# Patient Record
Sex: Female | Born: 1985 | Race: Black or African American | Hispanic: No | Marital: Single | Smoking: Never smoker
Health system: Southern US, Community
[De-identification: ages and names within clinical notes are randomized; demographics above are authoritative.]

## PROBLEM LIST (undated history)

## (undated) DIAGNOSIS — R112 Nausea with vomiting, unspecified: Secondary | ICD-10-CM

## (undated) DIAGNOSIS — D649 Anemia, unspecified: Secondary | ICD-10-CM

## (undated) DIAGNOSIS — R51 Headache: Secondary | ICD-10-CM

## (undated) DIAGNOSIS — D219 Benign neoplasm of connective and other soft tissue, unspecified: Secondary | ICD-10-CM

## (undated) DIAGNOSIS — Z9889 Other specified postprocedural states: Secondary | ICD-10-CM

## (undated) DIAGNOSIS — N809 Endometriosis, unspecified: Secondary | ICD-10-CM

## (undated) DIAGNOSIS — R011 Cardiac murmur, unspecified: Secondary | ICD-10-CM

## (undated) DIAGNOSIS — R519 Headache, unspecified: Secondary | ICD-10-CM

## (undated) HISTORY — DX: Benign neoplasm of connective and other soft tissue, unspecified: D21.9

## (undated) HISTORY — DX: Nausea with vomiting, unspecified: R11.2

## (undated) HISTORY — DX: Endometriosis, unspecified: N80.9

## (undated) HISTORY — DX: Other specified postprocedural states: Z98.890

---

## 2011-06-08 ENCOUNTER — Other Ambulatory Visit: Payer: Self-pay | Admitting: Internal Medicine

## 2011-06-08 DIAGNOSIS — N63 Unspecified lump in unspecified breast: Secondary | ICD-10-CM

## 2011-06-14 ENCOUNTER — Other Ambulatory Visit: Payer: Self-pay

## 2013-01-10 ENCOUNTER — Other Ambulatory Visit (HOSPITAL_COMMUNITY)
Admission: RE | Admit: 2013-01-10 | Discharge: 2013-01-10 | Disposition: A | Discharge status: Home / Self Care | Payer: No Typology Code available for payment source | Source: Ambulatory Visit | Attending: Family Medicine | Admitting: Family Medicine

## 2013-01-10 DIAGNOSIS — Z1151 Encounter for screening for human papillomavirus (HPV): Secondary | ICD-10-CM | POA: Insufficient documentation

## 2013-01-10 DIAGNOSIS — Z01419 Encounter for gynecological examination (general) (routine) without abnormal findings: Secondary | ICD-10-CM | POA: Insufficient documentation

## 2013-01-10 DIAGNOSIS — Z113 Encounter for screening for infections with a predominantly sexual mode of transmission: Secondary | ICD-10-CM | POA: Insufficient documentation

## 2015-10-08 ENCOUNTER — Emergency Department (HOSPITAL_COMMUNITY)
Admission: EM | Admit: 2015-10-08 | Discharge: 2015-10-09 | Disposition: A | Discharge status: Home / Self Care | Payer: 59 | Source: Home / Self Care

## 2015-10-08 ENCOUNTER — Encounter (HOSPITAL_COMMUNITY): Payer: Self-pay | Admitting: Emergency Medicine

## 2015-10-08 DIAGNOSIS — D649 Anemia, unspecified: Secondary | ICD-10-CM | POA: Insufficient documentation

## 2015-10-08 DIAGNOSIS — R112 Nausea with vomiting, unspecified: Secondary | ICD-10-CM | POA: Insufficient documentation

## 2015-10-08 DIAGNOSIS — N83201 Unspecified ovarian cyst, right side: Secondary | ICD-10-CM | POA: Insufficient documentation

## 2015-10-08 DIAGNOSIS — R000 Tachycardia, unspecified: Secondary | ICD-10-CM | POA: Diagnosis not present

## 2015-10-08 DIAGNOSIS — R1030 Lower abdominal pain, unspecified: Secondary | ICD-10-CM | POA: Insufficient documentation

## 2015-10-08 DIAGNOSIS — Z3202 Encounter for pregnancy test, result negative: Secondary | ICD-10-CM | POA: Diagnosis not present

## 2015-10-08 DIAGNOSIS — Z79899 Other long term (current) drug therapy: Secondary | ICD-10-CM | POA: Diagnosis not present

## 2015-10-08 DIAGNOSIS — D72829 Elevated white blood cell count, unspecified: Secondary | ICD-10-CM | POA: Diagnosis not present

## 2015-10-08 HISTORY — DX: Anemia, unspecified: D64.9

## 2015-10-08 LAB — COMPREHENSIVE METABOLIC PANEL
ALK PHOS: 51 U/L (ref 38–126)
ALT: 17 U/L (ref 14–54)
ANION GAP: 11 (ref 5–15)
AST: 23 U/L (ref 15–41)
Albumin: 4.5 g/dL (ref 3.5–5.0)
BILIRUBIN TOTAL: 0.4 mg/dL (ref 0.3–1.2)
BUN: 12 mg/dL (ref 6–20)
CALCIUM: 9.3 mg/dL (ref 8.9–10.3)
CO2: 23 mmol/L (ref 22–32)
Chloride: 106 mmol/L (ref 101–111)
Creatinine, Ser: 0.64 mg/dL (ref 0.44–1.00)
GLUCOSE: 111 mg/dL — AB (ref 65–99)
Potassium: 3.9 mmol/L (ref 3.5–5.1)
Sodium: 140 mmol/L (ref 135–145)
TOTAL PROTEIN: 8.3 g/dL — AB (ref 6.5–8.1)

## 2015-10-08 LAB — CBC
HCT: 42.4 % (ref 36.0–46.0)
HEMOGLOBIN: 14 g/dL (ref 12.0–15.0)
MCH: 27.9 pg (ref 26.0–34.0)
MCHC: 33 g/dL (ref 30.0–36.0)
MCV: 84.6 fL (ref 78.0–100.0)
Platelets: 294 10*3/uL (ref 150–400)
RBC: 5.01 MIL/uL (ref 3.87–5.11)
RDW: 19.4 % — ABNORMAL HIGH (ref 11.5–15.5)
WBC: 20.6 10*3/uL — AB (ref 4.0–10.5)

## 2015-10-08 LAB — LIPASE, BLOOD: Lipase: 22 U/L (ref 11–51)

## 2015-10-08 MED ORDER — ONDANSETRON 4 MG PO TBDP
4.0000 mg | ORAL_TABLET | Freq: Once | ORAL | Status: AC | PRN
  Administered 2015-10-08: 4 mg via ORAL
  Filled 2015-10-08: qty 1

## 2015-10-08 NOTE — ED Notes (Signed)
Below order not completed by EW. 

## 2015-10-08 NOTE — ED Notes (Signed)
Pt states she has lower abd cramping  Pt states she feels weak all over  Pt states she has had some nausea and vomiting

## 2015-10-09 ENCOUNTER — Emergency Department (HOSPITAL_BASED_OUTPATIENT_CLINIC_OR_DEPARTMENT_OTHER): Payer: 59

## 2015-10-09 ENCOUNTER — Encounter (HOSPITAL_BASED_OUTPATIENT_CLINIC_OR_DEPARTMENT_OTHER): Payer: Self-pay | Admitting: Emergency Medicine

## 2015-10-09 ENCOUNTER — Emergency Department (HOSPITAL_BASED_OUTPATIENT_CLINIC_OR_DEPARTMENT_OTHER)
Admission: EM | Admit: 2015-10-09 | Discharge: 2015-10-09 | Disposition: A | Discharge status: Home / Self Care | Payer: 59 | Attending: Emergency Medicine | Admitting: Emergency Medicine

## 2015-10-09 DIAGNOSIS — R1030 Lower abdominal pain, unspecified: Secondary | ICD-10-CM

## 2015-10-09 DIAGNOSIS — N83201 Unspecified ovarian cyst, right side: Secondary | ICD-10-CM

## 2015-10-09 DIAGNOSIS — D72829 Elevated white blood cell count, unspecified: Secondary | ICD-10-CM

## 2015-10-09 DIAGNOSIS — R109 Unspecified abdominal pain: Secondary | ICD-10-CM

## 2015-10-09 LAB — URINALYSIS, ROUTINE W REFLEX MICROSCOPIC
BILIRUBIN URINE: NEGATIVE
Glucose, UA: NEGATIVE mg/dL
KETONES UR: NEGATIVE mg/dL
Leukocytes, UA: NEGATIVE
NITRITE: NEGATIVE
SPECIFIC GRAVITY, URINE: 1.03 (ref 1.005–1.030)
pH: 5 (ref 5.0–8.0)

## 2015-10-09 LAB — WET PREP, GENITAL
Clue Cells Wet Prep HPF POC: NONE SEEN
Sperm: NONE SEEN
Trich, Wet Prep: NONE SEEN
YEAST WET PREP: NONE SEEN

## 2015-10-09 LAB — DIFFERENTIAL
BASOS PCT: 0 %
Basophils Absolute: 0 10*3/uL (ref 0.0–0.1)
EOS ABS: 0 10*3/uL (ref 0.0–0.7)
Eosinophils Relative: 0 %
LYMPHS PCT: 6 %
Lymphs Abs: 1 10*3/uL (ref 0.7–4.0)
Monocytes Absolute: 0.9 10*3/uL (ref 0.1–1.0)
Monocytes Relative: 5 %
NEUTROS PCT: 89 %
Neutro Abs: 16.3 10*3/uL — ABNORMAL HIGH (ref 1.7–7.7)

## 2015-10-09 LAB — URINE MICROSCOPIC-ADD ON

## 2015-10-09 LAB — PREGNANCY, URINE: PREG TEST UR: NEGATIVE

## 2015-10-09 MED ORDER — IOPAMIDOL (ISOVUE-300) INJECTION 61%
100.0000 mL | Freq: Once | INTRAVENOUS | Status: AC | PRN
  Administered 2015-10-09: 100 mL via INTRAVENOUS

## 2015-10-09 MED ORDER — SODIUM CHLORIDE 0.9 % IV BOLUS (SEPSIS)
1000.0000 mL | Freq: Once | INTRAVENOUS | Status: AC
  Administered 2015-10-09: 1000 mL via INTRAVENOUS

## 2015-10-09 MED ORDER — NAPROXEN 500 MG PO TABS: 500.0000 mg | ORAL_TABLET | Freq: Two times a day (BID) | ORAL | Status: DC

## 2015-10-09 MED ORDER — ONDANSETRON HCL 4 MG/2ML IJ SOLN
4.0000 mg | Freq: Once | INTRAMUSCULAR | Status: AC
  Administered 2015-10-09: 4 mg via INTRAVENOUS
  Filled 2015-10-09: qty 2

## 2015-10-09 MED ORDER — MORPHINE SULFATE (PF) 4 MG/ML IV SOLN
4.0000 mg | Freq: Once | INTRAVENOUS | Status: AC
  Administered 2015-10-09: 4 mg via INTRAVENOUS
  Filled 2015-10-09: qty 1

## 2015-10-09 MED ORDER — HYDROCODONE-ACETAMINOPHEN 5-325 MG PO TABS: 1.0000 | ORAL_TABLET | Freq: Four times a day (QID) | ORAL | Status: DC | PRN

## 2015-10-09 MED FILL — HYDROCODON-APAP 5-325: 5-325 | 2 days supply | Qty: 10 | Fill #0

## 2015-10-09 MED FILL — NAPROXEN 500 MG TABLET: 500 | 7 days supply | Qty: 14 | Fill #0

## 2015-10-09 NOTE — Discharge Instructions (Signed)
It is important that you follow-up with GYN faculty practice at [facility] hospital. Call [phone]. Also you can follow-up with the acute walk-in clinic at [facility] hospital. Further evaluation of this right ovarian cystic mass is important. Should be done in the next few weeks. Take the anti-inflammatory medicine as directed take pain medicine as needed. Return for any new or worse symptoms.

## 2015-10-09 NOTE — ED Notes (Signed)
Pt ambulated to bathroom 

## 2015-10-09 NOTE — ED Notes (Signed)
Pad given to pt.

## 2015-10-09 NOTE — ED Provider Notes (Signed)
Results for orders placed or performed during the hospital encounter of 10/09/15  Wet prep, genital  Result Value Ref Range   Yeast Wet Prep HPF POC NONE SEEN NONE SEEN   Trich, Wet Prep NONE SEEN NONE SEEN   Clue Cells Wet Prep HPF POC NONE SEEN NONE SEEN   WBC, Wet Prep HPF POC MODERATE (A) NONE SEEN   Sperm NONE SEEN   Differential  Result Value Ref Range   Neutrophils Relative % 89 %   Neutro Abs 16.3 (H) 1.7 - 7.7 K/uL   Lymphocytes Relative 6 %   Lymphs Abs 1.0 0.7 - 4.0 K/uL   Monocytes Relative 5 %   Monocytes Absolute 0.9 0.1 - 1.0 K/uL   Eosinophils Relative 0 %   Eosinophils Absolute 0.0 0.0 - 0.7 K/uL   Basophils Relative 0 %   Basophils Absolute 0.0 0.0 - 0.1 K/uL  Urinalysis, Routine w reflex microscopic (not at [facility])  Result Value Ref Range   Color, Urine RED (A) YELLOW   APPearance CLOUDY (A) CLEAR   Specific Gravity, Urine 1.030 1.005 - 1.030   pH 5.0 5.0 - 8.0   Glucose, UA NEGATIVE NEGATIVE mg/dL   Hgb urine dipstick LARGE (A) NEGATIVE   Bilirubin Urine NEGATIVE NEGATIVE   Ketones, ur NEGATIVE NEGATIVE mg/dL   Protein, ur >300 (A) NEGATIVE mg/dL   Nitrite NEGATIVE NEGATIVE   Leukocytes, UA NEGATIVE NEGATIVE  Pregnancy, urine  Result Value Ref Range   Preg Test, Ur NEGATIVE NEGATIVE  Urine microscopic-add on  Result Value Ref Range   Squamous Epithelial / LPF 0-5 (A) NONE SEEN   WBC, UA 0-5 0 - 5 WBC/hpf   RBC / HPF TOO NUMEROUS TO COUNT 0 - 5 RBC/hpf   Bacteria, UA RARE (A) NONE SEEN   Urine-Other AMORPHOUS URATES/PHOSPHATES    US Transvaginal Non-ob  10/09/2015  CLINICAL DATA:  Acute onset mid pelvic pain, nausea and vomiting yesterday. Fibroids. Right adnexal mass seen on recent CT. LMP 10/08/2015. EXAM: TRANSABDOMINAL AND TRANSVAGINAL ULTRASOUND OF PELVIS DOPPLER ULTRASOUND OF OVARIES TECHNIQUE: Both transabdominal and transvaginal ultrasound examinations of the pelvis were performed. Transabdominal technique was performed for global imaging of the  pelvis including uterus, ovaries, adnexal regions, and pelvic cul-de-sac. It was necessary to proceed with endovaginal exam following the transabdominal exam to visualize the endometrium and complex right adnexal mass. Color and duplex Doppler ultrasound was utilized to evaluate blood flow to the ovaries. COMPARISON:  CT on 10/09/2015 FINDINGS: Uterus Measurements: 10.2 x 5.4 x 5.5 cm. Several uterine fibroids are seen which are intramural and subserosal location in the upper uterine body and fundus. These range in size from 2.0 cm to 5.8 cm. Endometrium Thickness: 9 mm.  Partially obscured by uterine fibroids. Right ovary Measurements: No normal ovary visualized. A complex cystic and solid mass is seen in the right adnexa which measures 6.7 by 4.6 by 4.8 cm. This mass shows several septations and a solid appearing hyperechoic component, but no corresponding fat is seen in this lesion on recent CT. Some internal blood flow is seen within the periphery as well as the central solid portions of this mass on color Doppler ultrasound. Left ovary Measurements: 3.6 x 2.1 x 2.4 cm transabdominally. Normal transabdominal appearance. Pulsed Doppler evaluation shows normal low-resistance arterial and venous waveforms in the left ovary as well as the right adnexal mass. Other findings Small amount of complex free fluid noted. IMPRESSION: Multiple intramural and subserosal fibroids, largest measuring 5.8 cm. 6.7  cm indeterminate complex cystic mass in right adnexa, which does show internal blood flow. Differential diagnosis includes ovarian neoplasm, degenerated pedunculated fibroid, and endometrioma. No definite sonographic findings of ovarian torsion. Recommend GYN consultation, and consider surgical evaluation or pelvic MRI without and with contrast for further characterization. Electronically Signed   By: Earle Gell M.D.   On: 10/09/2015 10:25   US Pelvis Complete  10/09/2015  CLINICAL DATA:  Acute onset mid pelvic pain,  nausea and vomiting yesterday. Fibroids. Right adnexal mass seen on recent CT. LMP 10/08/2015. EXAM: TRANSABDOMINAL AND TRANSVAGINAL ULTRASOUND OF PELVIS DOPPLER ULTRASOUND OF OVARIES TECHNIQUE: Both transabdominal and transvaginal ultrasound examinations of the pelvis were performed. Transabdominal technique was performed for global imaging of the pelvis including uterus, ovaries, adnexal regions, and pelvic cul-de-sac. It was necessary to proceed with endovaginal exam following the transabdominal exam to visualize the endometrium and complex right adnexal mass. Color and duplex Doppler ultrasound was utilized to evaluate blood flow to the ovaries. COMPARISON:  CT on 10/09/2015 FINDINGS: Uterus Measurements: 10.2 x 5.4 x 5.5 cm. Several uterine fibroids are seen which are intramural and subserosal location in the upper uterine body and fundus. These range in size from 2.0 cm to 5.8 cm. Endometrium Thickness: 9 mm.  Partially obscured by uterine fibroids. Right ovary Measurements: No normal ovary visualized. A complex cystic and solid mass is seen in the right adnexa which measures 6.7 by 4.6 by 4.8 cm. This mass shows several septations and a solid appearing hyperechoic component, but no corresponding fat is seen in this lesion on recent CT. Some internal blood flow is seen within the periphery as well as the central solid portions of this mass on color Doppler ultrasound. Left ovary Measurements: 3.6 x 2.1 x 2.4 cm transabdominally. Normal transabdominal appearance. Pulsed Doppler evaluation shows normal low-resistance arterial and venous waveforms in the left ovary as well as the right adnexal mass. Other findings Small amount of complex free fluid noted. IMPRESSION: Multiple intramural and subserosal fibroids, largest measuring 5.8 cm. 6.7 cm indeterminate complex cystic mass in right adnexa, which does show internal blood flow. Differential diagnosis includes ovarian neoplasm, degenerated pedunculated fibroid,  and endometrioma. No definite sonographic findings of ovarian torsion. Recommend GYN consultation, and consider surgical evaluation or pelvic MRI without and with contrast for further characterization. Electronically Signed   By: Earle Gell M.D.   On: 10/09/2015 10:25   Ct Abdomen Pelvis W Contrast  10/09/2015  CLINICAL DATA:  Lower abdominal and pelvic pain. The pain began after a bowel movement 12 hours ago. EXAM: CT ABDOMEN AND PELVIS WITH CONTRAST TECHNIQUE: Multidetector CT imaging of the abdomen and pelvis was performed using the standard protocol following bolus administration of intravenous contrast. CONTRAST:  148mL ISOVUE-300 IOPAMIDOL (ISOVUE-300) INJECTION 61% COMPARISON:  None. FINDINGS: Lower chest: The lung bases are clear without focal nodule mass or airspace disease. The heart size is normal. No significant pleural or pericardial effusion is present. Hepatobiliary: The liver, gallbladder, and the common bile duct are within normal limits. Pancreas: Unremarkable Spleen: Within normal limits Adrenals/Urinary Tract: The adrenal glands are normal bilaterally. Kidneys and ureters are within normal limits. The urinary bladder is mostly collapsed. Stomach/Bowel: The stomach and duodenum are within normal limits. The small bowel is unremarkable. The appendix and cecum are visualized and normal. The ascending and transverse colon are within normal limits. The descending and rectosigmoid colon are unremarkable. There is some stranding within the mesentery throughout the abdomen. Vascular/Lymphatic: No significant atherosclerotic calcifications, stenosis,  or aneurysm. Reproductive: 2 large exophytic fibroids are present at the uterine fundus. The largest measures 5.7 cm. Multiple additional fibroids are present throughout the uterus. A low-density right adnexal mass measures 4.4 x 5.4 x 6.5 cm. A moderate amount of free fluid is present within the anatomic pelvis. The left ovary is visualized and within  normal limits. Other: A moderate amount of free fluid is present in the anatomic pelvis. The fluid is of intermediate density measuring approximately 30 Hounsfield units. Musculoskeletal: Bone windows are unremarkable. No focal lytic or blastic lesions are present. IMPRESSION: 1. 6.5 cm low-density right adnexal mass. The differential diagnosis includes torsion of the ovary, tubo-ovarian abscess, or less likely neoplasm. Recommend pelvic ultrasound with Doppler examination for further evaluation. 2. Fibroid uterus.  This could be a separate source of pain. 3. High-density pelvic fluid. This raises concern for infection or hemorrhage. No active hemorrhage is evident. 4. The bowel appears to be within normal limits. These results were called by telephone at the time of interpretation on 10/09/2015 at 7:16 am to Dr. Rogene Houston, who verbally acknowledged these results. Electronically Signed   By: San Morelle M.D.   On: 10/09/2015 07:26   [place] Art/ven Flow Abd Pelv Doppler  10/09/2015  CLINICAL DATA:  Acute onset mid pelvic pain, nausea and vomiting yesterday. Fibroids. Right adnexal mass seen on recent CT. LMP 10/08/2015. EXAM: TRANSABDOMINAL AND TRANSVAGINAL ULTRASOUND OF PELVIS DOPPLER ULTRASOUND OF OVARIES TECHNIQUE: Both transabdominal and transvaginal ultrasound examinations of the pelvis were performed. Transabdominal technique was performed for global imaging of the pelvis including uterus, ovaries, adnexal regions, and pelvic cul-de-sac. It was necessary to proceed with endovaginal exam following the transabdominal exam to visualize the endometrium and complex right adnexal mass. Color and duplex Doppler ultrasound was utilized to evaluate blood flow to the ovaries. COMPARISON:  CT on 10/09/2015 FINDINGS: Uterus Measurements: 10.2 x 5.4 x 5.5 cm. Several uterine fibroids are seen which are intramural and subserosal location in the upper uterine body and fundus. These range in size from 2.0 cm to 5.8 cm.  Endometrium Thickness: 9 mm.  Partially obscured by uterine fibroids. Right ovary Measurements: No normal ovary visualized. A complex cystic and solid mass is seen in the right adnexa which measures 6.7 by 4.6 by 4.8 cm. This mass shows several septations and a solid appearing hyperechoic component, but no corresponding fat is seen in this lesion on recent CT. Some internal blood flow is seen within the periphery as well as the central solid portions of this mass on color Doppler ultrasound. Left ovary Measurements: 3.6 x 2.1 x 2.4 cm transabdominally. Normal transabdominal appearance. Pulsed Doppler evaluation shows normal low-resistance arterial and venous waveforms in the left ovary as well as the right adnexal mass. Other findings Small amount of complex free fluid noted. IMPRESSION: Multiple intramural and subserosal fibroids, largest measuring 5.8 cm. 6.7 cm indeterminate complex cystic mass in right adnexa, which does show internal blood flow. Differential diagnosis includes ovarian neoplasm, degenerated pedunculated fibroid, and endometrioma. No definite sonographic findings of ovarian torsion. Recommend GYN consultation, and consider surgical evaluation or pelvic MRI without and with contrast for further characterization. Electronically Signed   By: Earle Gell M.D.   On: 10/09/2015 10:25    Patient with complex right ovarian cystic mass. No evidence of torsion. Early and close follow-up with GYN is important. Patient given referral information to [facility] walk[facility] clinic and to the GYN faculty service.  Fortunately was able to get the ultrasound done  here for further evaluation.  Fredia Sorrow, MD 10/09/15 1039

## 2015-10-09 NOTE — ED Notes (Signed)
Pt transported to US

## 2015-10-09 NOTE — ED Notes (Addendum)
Pt reports severe abd pain onset yesterday after attempting BM

## 2015-10-09 NOTE — ED Provider Notes (Addendum)
TIME SEEN: 4:35 AM  CHIEF COMPLAINT: Abdominal pain, vomiting  HPI: Pt is a 30 y.o. female with history of anemia who is on iron who presents to the emergency department with severe lower abdominal pain that started yesterday after having a bowel movement. Reports he was able to have a bowel movement without blood. States her stools are always slightly black because of iron tablets but have not changed. Reports pain is worse in the left lower quadrant. She has felt hot and cold but has not measured a fever. Has had nausea and vomiting. No diarrhea. Denies dysuria, hematuria, vaginal discharge. Started her menstrual cycle today. No sick contacts or recent travel. No history of abdominal surgery. She is a G2 P0 A2.  Is sexually active with one partner.  Has had trich in the past that was treated.  ROS: See HPI Constitutional: no fever  Eyes: no drainage  ENT: no runny nose   Cardiovascular:  no chest pain  Resp: no SOB  GI: vomiting GU: no dysuria Integumentary: no rash  Allergy: no hives  Musculoskeletal: no leg swelling  Neurological: no slurred speech ROS otherwise negative  PAST MEDICAL HISTORY/PAST SURGICAL HISTORY:  Past Medical History  Diagnosis Date  . Anemia     MEDICATIONS:  Prior to Admission medications   Medication Sig Start Date End Date Taking? Authorizing Provider  ferrous sulfate 325 (65 FE) MG tablet Take 325 mg by mouth 3 (three) times daily. 09/08/15   Historical Provider, MD  ibuprofen (ADVIL,MOTRIN) 200 MG tablet Take 200 mg by mouth every 6 (six) hours as needed for moderate pain.    Historical Provider, MD  Multiple Vitamin (MULTIVITAMIN WITH MINERALS) TABS tablet Take 1 tablet by mouth daily.    Historical Provider, MD    ALLERGIES:  Allergies  Allergen Reactions  . Other Hives and Other (See Comments)    mango    SOCIAL HISTORY:  Social History  Substance Use Topics  . Smoking status: Never Smoker   . Smokeless tobacco: Not on file  . Alcohol Use:  No    FAMILY HISTORY: Family History  Problem Relation Age of Onset  . Hypertension Other   . Diabetes Other     EXAM: BP 135/81 mmHg  Pulse 109  Temp(Src) 98.3 F (36.8 C) (Oral)  Resp 18  SpO2 100%  LMP 09/13/2015 (Approximate) CONSTITUTIONAL: Alert and oriented and responds appropriately to questions. Well-appearing; well-nourished HEAD: Normocephalic EYES: Conjunctivae clear, PERRL ENT: normal nose; no rhinorrhea; moist mucous membranes NECK: Supple, no meningismus, no LAD  CARD: regular and minimally tachycardic; S1 and S2 appreciated; no murmurs, no clicks, no rubs, no gallops RESP: Normal chest excursion without splinting or tachypnea; breath sounds clear and equal bilaterally; no wheezes, no rhonchi, no rales, no hypoxia or respiratory distress, speaking full sentences ABD/GI: Normal bowel sounds; non-distended; soft,Tender to palpation throughout the lower abdomen with some guarding in the right and left lower quadrants, no rebound, no peritoneal signs GU:  Normal external genitalia. No lesions, rashes noted. Patient has moderate dark red vaginal bleeding on exam coming from the cervical os. No vaginal discharge.  No adnexal tenderness, mass or fullness, no cervical motion tenderness. Cervix is not appear friable.  Cervix is closed.  Chaperone present for exam. She did not seem to have a specific cervical motion tenderness or adnexal tenderness on exam but was tender over her lower abdomen during palpation during the bimanual exam. BACK:  The back appears normal and is non-tender to palpation,  there is no CVA tenderness EXT: Normal ROM in all joints; non-tender to palpation; no edema; normal capillary refill; no cyanosis, no calf tenderness or swelling    SKIN: Normal color for age and race; warm; no rash NEURO: Moves all extremities equally, sensation to light touch intact diffusely, cranial nerves II through XII intact PSYCH: The patient's mood and manner are appropriate.  Grooming and personal hygiene are appropriate.  MEDICAL DECISION MAKING: Patient here with abdominal pain, vomiting, leukocytosis. Was in the emergency room and at [facility] and left prior to being seen by a physician secondary to a time. Have a leukocytosis of 20,000 there. Otherwise labs are unremarkable.  Differential shows that she does have a left shift. Urine shows blood but this is likely secondary to her menstrual cycle. She is not pregnant. No other sign of infection in her urine. Wet prep shows moderate WBCs. She had tenderness over the suprapubic area when I palpated her abdomen on pelvic exam but did not have  adnexal tenderness or cervical motion tenderness appreciated. Given her lower abdominal pain and leukocytosis and concern for possible appendicitis, diverticulitis, colitis. We'll obtain a CT scan. Pain is improved with IV morphine.  ED PROGRESS: 7:30 AM  Signed out to Dr. Rogene Houston to follow up on CT scan.  She is not pregnant so doubt ectopic.  No significant vag dc or CMT to suggest PID.  Cervix is normal, doubt cervicitis.  GC/Chlamydia pending.  Wet prep negative for trich.  No adnexal tenderness to suggest TOA or torsion.       Malcolm, DO 10/09/15 [phone]

## 2015-10-09 NOTE — ED Notes (Signed)
Pt reported to registration clerk that they were leaving  

## 2015-10-12 LAB — GC/CHLAMYDIA PROBE AMP (~~LOC~~) NOT AT ARMC
Chlamydia: NEGATIVE
Neisseria Gonorrhea: NEGATIVE

## 2015-10-29 ENCOUNTER — Ambulatory Visit (INDEPENDENT_AMBULATORY_CARE_PROVIDER_SITE_OTHER): Payer: 59 | Admitting: Family Medicine

## 2015-10-29 ENCOUNTER — Encounter: Payer: Self-pay | Admitting: Family Medicine

## 2015-10-29 VITALS — BP 134/82 | HR 98 | Temp 98.3°F | Ht 64.0 in | Wt 210.8 lb

## 2015-10-29 DIAGNOSIS — N83201 Unspecified ovarian cyst, right side: Secondary | ICD-10-CM

## 2015-10-29 NOTE — Patient Instructions (Signed)
Ovarian Cyst An ovarian cyst is a fluid-filled sac that forms on an ovary. The ovaries are small organs that produce eggs in women. Various types of cysts can form on the ovaries. Most are not cancerous. Many do not cause problems, and they often go away on their own. Some may cause symptoms and require treatment. Common types of ovarian cysts include:  Functional cysts--These cysts may occur every month during the menstrual cycle. This is normal. The cysts usually go away with the next menstrual cycle if the woman does not get pregnant. Usually, there are no symptoms with a functional cyst.  Endometrioma cysts--These cysts form from the tissue that lines the uterus. They are also called "chocolate cysts" because they become filled with blood that turns brown. This type of cyst can cause pain in the lower abdomen during intercourse and with your menstrual period.  Cystadenoma cysts--This type develops from the cells on the outside of the ovary. These cysts can get very big and cause lower abdomen pain and pain with intercourse. This type of cyst can twist on itself, cut off its blood supply, and cause severe pain. It can also easily rupture and cause a lot of pain.  Dermoid cysts--This type of cyst is sometimes found in both ovaries. These cysts may contain different kinds of body tissue, such as skin, teeth, hair, or cartilage. They usually do not cause symptoms unless they get very big.  Theca lutein cysts--These cysts occur when too much of a certain hormone (human chorionic gonadotropin) is produced and overstimulates the ovaries to produce an egg. This is most common after procedures used to assist with the conception of a baby (in vitro fertilization). CAUSES   Fertility drugs can cause a condition in which multiple large cysts are formed on the ovaries. This is called ovarian hyperstimulation syndrome.  A condition called polycystic ovary syndrome can cause hormonal imbalances that can lead to  nonfunctional ovarian cysts. SIGNS AND SYMPTOMS  Many ovarian cysts do not cause symptoms. If symptoms are present, they may include:  Pelvic pain or pressure.  Pain in the lower abdomen.  Pain during sexual intercourse.  Increasing girth (swelling) of the abdomen.  Abnormal menstrual periods.  Increasing pain with menstrual periods.  Stopping having menstrual periods without being pregnant. DIAGNOSIS  These cysts are commonly found during a routine or annual pelvic exam. Tests may be ordered to find out more about the cyst. These tests may include:  Ultrasound.  X-ray of the pelvis.  CT scan.  MRI.  Blood tests. TREATMENT  Many ovarian cysts go away on their own without treatment. Your health care provider may want to check your cyst regularly for 2-3 months to see if it changes. For women in menopause, it is particularly important to monitor a cyst closely because of the higher rate of ovarian cancer in menopausal women. When treatment is needed, it may include any of the following:  A procedure to drain the cyst (aspiration). This may be done using a long needle and ultrasound. It can also be done through a laparoscopic procedure. This involves using a thin, lighted tube with a tiny camera on the end (laparoscope) inserted through a small incision.  Surgery to remove the whole cyst. This may be done using laparoscopic surgery or an open surgery involving a larger incision in the lower abdomen.  Hormone treatment or birth control pills. These methods are sometimes used to help dissolve a cyst. HOME CARE INSTRUCTIONS   Only take over-the-counter   or prescription medicines as directed by your health care provider.  Follow up with your health care provider as directed.  Get regular pelvic exams and Pap tests. SEEK MEDICAL CARE IF:   Your periods are late, irregular, or painful, or they stop.  Your pelvic pain or abdominal pain does not go away.  Your abdomen becomes  larger or swollen.  You have pressure on your bladder or trouble emptying your bladder completely.  You have pain during sexual intercourse.  You have feelings of fullness, pressure, or discomfort in your stomach.  You lose weight for no apparent reason.  You feel generally ill.  You become constipated.  You lose your appetite.  You develop acne.  You have an increase in body and facial hair.  You are gaining weight, without changing your exercise and eating habits.  You think you are pregnant. SEEK IMMEDIATE MEDICAL CARE IF:   You have increasing abdominal pain.  You feel sick to your stomach (nauseous), and you throw up (vomit).  You develop a fever that comes on suddenly.  You have abdominal pain during a bowel movement.  Your menstrual periods become heavier than usual. MAKE SURE YOU:  Understand these instructions.  Will watch your condition.  Will get help right away if you are not doing well or get worse.   This information is not intended to replace advice given to you by your health care provider. Make sure you discuss any questions you have with your health care provider.   Document Released: 06/27/2005 Document Revised: 07/02/2013 Document Reviewed: 03/04/2013 [employer] Interactive Patient Education 2016 [employer]  

## 2015-10-29 NOTE — Progress Notes (Signed)
   Subjective:    Patient ID: Katie Ward, female    DOB: 18-Aug-1985, 30 y.o.   MRN: [id number]  HPI Patient seen for ED follow up of ovarian cyst - she was seen 3/30-31 at [facility].  Presented to hospital with moderate to severe cramping right lower quadrant that was nonradiating.  The pain doubled her over.  Pain has been improving since her ED visit - pain has completely resolved now.  No bleeding, vaginal discharge, dysuria, constipation.     Review of Systems  Constitutional: Negative for fever, chills and fatigue.   I have reviewed the patients past medical, family, and social history.  I have reviewed the patient's medication list and allergies.     Objective:   Physical Exam  Constitutional: She is oriented to person, place, and time. She appears well-developed and well-nourished.  HENT:  Head: Normocephalic and atraumatic.  Cardiovascular: Normal rate, regular rhythm and normal heart sounds.  Exam reveals no gallop and no friction rub.   No murmur heard. Pulmonary/Chest: Effort normal and breath sounds normal. No respiratory distress. She has no wheezes. She has no rales. She exhibits no tenderness.  Abdominal: Soft. Bowel sounds are normal. She exhibits no distension and no mass. There is no tenderness. There is no rebound and no guarding.  Neurological: She is alert and oriented to person, place, and time.  Skin: Skin is warm and dry.  Psychiatric: She has a normal mood and affect. Her behavior is normal. Judgment and thought content normal.      Assessment & Plan:  1. Right ovarian cyst No symptoms.  If cyst same or larger, will get MRI.  If US shows resolving, will screen appropriately.  Schedule for annual exam. - US Pelvis Limited; Future - CA 125

## 2015-10-30 LAB — CA 125: CA 125: 45 U/mL — ABNORMAL HIGH (ref <35)

## 2015-11-19 ENCOUNTER — Other Ambulatory Visit: Payer: Self-pay

## 2015-11-19 ENCOUNTER — Other Ambulatory Visit: Payer: Self-pay | Admitting: Family Medicine

## 2015-11-19 ENCOUNTER — Ambulatory Visit (HOSPITAL_COMMUNITY)
Admission: RE | Admit: 2015-11-19 | Discharge: 2015-11-19 | Disposition: A | Discharge status: Home / Self Care | Payer: 59 | Source: Ambulatory Visit | Attending: Family Medicine | Admitting: Family Medicine

## 2015-11-19 DIAGNOSIS — N83201 Unspecified ovarian cyst, right side: Secondary | ICD-10-CM

## 2015-11-19 DIAGNOSIS — N858 Other specified noninflammatory disorders of uterus: Secondary | ICD-10-CM | POA: Diagnosis not present

## 2015-11-19 DIAGNOSIS — D259 Leiomyoma of uterus, unspecified: Secondary | ICD-10-CM | POA: Insufficient documentation

## 2015-11-20 NOTE — Progress Notes (Signed)
Quick Note:  Discussed results with patient - cyst is enlarging. Needs appt with GYN surgeon for ovarian cystectomy and removal pedunculated firbroids ______

## 2015-12-17 ENCOUNTER — Ambulatory Visit (INDEPENDENT_AMBULATORY_CARE_PROVIDER_SITE_OTHER): Payer: 59 | Admitting: Obstetrics and Gynecology

## 2015-12-17 ENCOUNTER — Encounter: Payer: Self-pay | Admitting: Obstetrics and Gynecology

## 2015-12-17 VITALS — BP 140/70 | HR 79 | Ht 64.0 in | Wt 217.9 lb

## 2015-12-17 DIAGNOSIS — N9489 Other specified conditions associated with female genital organs and menstrual cycle: Secondary | ICD-10-CM

## 2015-12-17 DIAGNOSIS — N949 Unspecified condition associated with female genital organs and menstrual cycle: Secondary | ICD-10-CM | POA: Diagnosis not present

## 2015-12-17 DIAGNOSIS — N80129 Deep endometriosis of ovary, unspecified ovary: Secondary | ICD-10-CM | POA: Insufficient documentation

## 2015-12-17 DIAGNOSIS — N801 Endometriosis of ovary: Secondary | ICD-10-CM | POA: Insufficient documentation

## 2015-12-17 LAB — POCT URINALYSIS DIP (DEVICE)
BILIRUBIN URINE: NEGATIVE
Glucose, UA: NEGATIVE mg/dL
HGB URINE DIPSTICK: NEGATIVE
Ketones, ur: NEGATIVE mg/dL
Leukocytes, UA: NEGATIVE
Nitrite: NEGATIVE
Protein, ur: NEGATIVE mg/dL
SPECIFIC GRAVITY, URINE: 1.025 (ref 1.005–1.030)
Urobilinogen, UA: 0.2 mg/dL (ref 0.0–1.0)
pH: 6 (ref 5.0–8.0)
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
## 2015-12-18 ENCOUNTER — Encounter (HOSPITAL_COMMUNITY): Payer: Self-pay | Admitting: *Deleted

## 2016-01-21 ENCOUNTER — Other Ambulatory Visit: Payer: Self-pay | Admitting: Obstetrics and Gynecology

## 2016-01-25 NOTE — Patient Instructions (Signed)
Your procedure is scheduled on:  Tuesday, Feb 09, 2016  Enter through the [employer] [facility] at:  6:00 AM  Pick up the phone at the desk and dial [phone].  Call this number if you have problems the morning of surgery: [phone].  Remember: Do NOT eat food or drink after:  Midnight Monday, June 31, 2017  Take these medicines the morning of surgery with a SIP OF WATER:  None  Do NOT wear jewelry (body piercing), metal hair clips/bobby pins, make-up, or nail polish. Do NOT wear lotions, powders, or perfumes.  You may wear deodorant. Do NOT shave for 48 hours prior to surgery. Do NOT bring valuables to the hospital. Contacts, dentures, or bridgework may not be worn into surgery.  Leave suitcase in car.  After surgery it may be brought to your room.  For patients admitted to the hospital, checkout time is 11:00 AM the day of discharge.  Have a responsible adult drive you home and stay with you for 24 hours after your procedure

## 2016-01-26 ENCOUNTER — Encounter (HOSPITAL_COMMUNITY)
Admission: RE | Admit: 2016-01-26 | Discharge: 2016-01-26 | Disposition: A | Discharge status: Home / Self Care | Payer: 59 | Source: Ambulatory Visit | Attending: Obstetrics and Gynecology | Admitting: Obstetrics and Gynecology

## 2016-01-26 ENCOUNTER — Encounter (HOSPITAL_COMMUNITY): Payer: Self-pay

## 2016-01-26 DIAGNOSIS — Z01812 Encounter for preprocedural laboratory examination: Secondary | ICD-10-CM | POA: Diagnosis not present

## 2016-01-26 HISTORY — DX: Cardiac murmur, unspecified: R01.1

## 2016-01-26 HISTORY — DX: Headache, unspecified: R51.9

## 2016-01-26 HISTORY — DX: Headache: R51

## 2016-01-26 LAB — TYPE AND SCREEN
ABO/RH(D): O POS
Antibody Screen: NEGATIVE

## 2016-01-26 LAB — CBC
HEMATOCRIT: 37.5 % (ref 36.0–46.0)
HEMOGLOBIN: 13 g/dL (ref 12.0–15.0)
MCH: 30 pg (ref 26.0–34.0)
MCHC: 34.7 g/dL (ref 30.0–36.0)
MCV: 86.4 fL (ref 78.0–100.0)
Platelets: 282 10*3/uL (ref 150–400)
RBC: 4.34 MIL/uL (ref 3.87–5.11)
RDW: 13.4 % (ref 11.5–15.5)
WBC: 10.6 10*3/uL — AB (ref 4.0–10.5)

## 2016-01-26 LAB — ABO/RH: ABO/RH(D): O POS

## 2016-02-08 ENCOUNTER — Encounter (HOSPITAL_COMMUNITY): Payer: Self-pay | Admitting: Anesthesiology

## 2016-02-09 ENCOUNTER — Ambulatory Visit (HOSPITAL_COMMUNITY): Payer: 59 | Admitting: Anesthesiology

## 2016-02-09 ENCOUNTER — Encounter (HOSPITAL_COMMUNITY): Payer: Self-pay | Admitting: *Deleted

## 2016-02-09 ENCOUNTER — Ambulatory Visit (HOSPITAL_COMMUNITY)
Admission: RE | Admit: 2016-02-09 | Discharge: 2016-02-09 | Disposition: A | Discharge status: Home / Self Care | Payer: 59 | Source: Ambulatory Visit | Attending: Obstetrics and Gynecology | Admitting: Obstetrics and Gynecology

## 2016-02-09 ENCOUNTER — Other Ambulatory Visit: Payer: Self-pay | Admitting: Obstetrics and Gynecology

## 2016-02-09 ENCOUNTER — Encounter (HOSPITAL_COMMUNITY)
Admission: RE | Disposition: A | Discharge status: Home / Self Care | Payer: Self-pay | Source: Ambulatory Visit | Attending: Obstetrics and Gynecology

## 2016-02-09 DIAGNOSIS — N882 Stricture and stenosis of cervix uteri: Secondary | ICD-10-CM | POA: Diagnosis not present

## 2016-02-09 DIAGNOSIS — N809 Endometriosis, unspecified: Secondary | ICD-10-CM | POA: Diagnosis not present

## 2016-02-09 DIAGNOSIS — N800 Endometriosis of uterus: Secondary | ICD-10-CM | POA: Diagnosis not present

## 2016-02-09 DIAGNOSIS — D252 Subserosal leiomyoma of uterus: Secondary | ICD-10-CM | POA: Diagnosis not present

## 2016-02-09 DIAGNOSIS — D259 Leiomyoma of uterus, unspecified: Secondary | ICD-10-CM | POA: Diagnosis present

## 2016-02-09 HISTORY — PX: LAPAROSCOPY: SHX197

## 2016-02-09 LAB — PREGNANCY, URINE: PREG TEST UR: NEGATIVE

## 2016-02-09 SURGERY — LAPAROSCOPY, DIAGNOSTIC
Anesthesia: General | Site: Abdomen | Laterality: Right

## 2016-02-09 MED ORDER — IBUPROFEN 200 MG PO TABS
200.0000 mg | ORAL_TABLET | Freq: Four times a day (QID) | ORAL | Status: DC | PRN
  Filled 2016-02-09: qty 2

## 2016-02-09 MED ORDER — HYDROCODONE-ACETAMINOPHEN 7.5-325 MG PO TABS: 1.0000 | ORAL_TABLET | Freq: Once | ORAL | Status: DC | PRN

## 2016-02-09 MED ORDER — PROMETHAZINE HCL 25 MG/ML IJ SOLN
6.2500 mg | INTRAMUSCULAR | Status: DC | PRN
  Administered 2016-02-09: 12.5 mg via INTRAVENOUS

## 2016-02-09 MED ORDER — ROCURONIUM BROMIDE 100 MG/10ML IV SOLN
INTRAVENOUS | Status: DC | PRN
  Administered 2016-02-09: 40 mg via INTRAVENOUS
  Administered 2016-02-09 (×2): 10 mg via INTRAVENOUS

## 2016-02-09 MED ORDER — SCOPOLAMINE 1 MG/3DAYS TD PT72
MEDICATED_PATCH | TRANSDERMAL | Status: AC
  Administered 2016-02-09: 1.5 mg via TRANSDERMAL
  Filled 2016-02-09: qty 1

## 2016-02-09 MED ORDER — ONDANSETRON HCL 4 MG/2ML IJ SOLN
INTRAMUSCULAR | Status: DC | PRN
  Administered 2016-02-09: 4 mg via INTRAVENOUS

## 2016-02-09 MED ORDER — LIDOCAINE HCL (CARDIAC) 20 MG/ML IV SOLN
INTRAVENOUS | Status: AC
  Filled 2016-02-09: qty 5

## 2016-02-09 MED ORDER — PROMETHAZINE HCL 25 MG/ML IJ SOLN
INTRAMUSCULAR | Status: AC
  Filled 2016-02-09: qty 1

## 2016-02-09 MED ORDER — SCOPOLAMINE 1 MG/3DAYS TD PT72
1.0000 | MEDICATED_PATCH | Freq: Once | TRANSDERMAL | Status: DC
  Administered 2016-02-09: 1.5 mg via TRANSDERMAL

## 2016-02-09 MED ORDER — MIDAZOLAM HCL 2 MG/2ML IJ SOLN
INTRAMUSCULAR | Status: DC | PRN
  Administered 2016-02-09: 2 mg via INTRAVENOUS

## 2016-02-09 MED ORDER — LIDOCAINE HCL (CARDIAC) 20 MG/ML IV SOLN
INTRAVENOUS | Status: DC | PRN
  Administered 2016-02-09: 80 mg via INTRAVENOUS

## 2016-02-09 MED ORDER — FLUCONAZOLE 150 MG PO TABS: 150.0000 mg | ORAL_TABLET | Freq: Once | ORAL | 1 refills | Status: AC

## 2016-02-09 MED ORDER — DEXAMETHASONE SODIUM PHOSPHATE 10 MG/ML IJ SOLN
INTRAMUSCULAR | Status: AC
  Filled 2016-02-09: qty 1

## 2016-02-09 MED ORDER — PROPOFOL 10 MG/ML IV BOLUS
INTRAVENOUS | Status: AC
  Filled 2016-02-09: qty 20

## 2016-02-09 MED ORDER — MEPERIDINE HCL 25 MG/ML IJ SOLN: 6.2500 mg | INTRAMUSCULAR | Status: DC | PRN

## 2016-02-09 MED ORDER — ONDANSETRON HCL 4 MG/2ML IJ SOLN
INTRAMUSCULAR | Status: AC
  Filled 2016-02-09: qty 2

## 2016-02-09 MED ORDER — DEXAMETHASONE SODIUM PHOSPHATE 10 MG/ML IJ SOLN
INTRAMUSCULAR | Status: DC | PRN
  Administered 2016-02-09: 10 mg via INTRAVENOUS

## 2016-02-09 MED ORDER — FENTANYL CITRATE (PF) 100 MCG/2ML IJ SOLN
INTRAMUSCULAR | Status: DC | PRN
  Administered 2016-02-09 (×2): 100 ug via INTRAVENOUS
  Administered 2016-02-09: 50 ug via INTRAVENOUS
  Administered 2016-02-09: 100 ug via INTRAVENOUS

## 2016-02-09 MED ORDER — FENTANYL CITRATE (PF) 100 MCG/2ML IJ SOLN: 25.0000 ug | INTRAMUSCULAR | Status: DC | PRN

## 2016-02-09 MED ORDER — KETOROLAC TROMETHAMINE 30 MG/ML IJ SOLN
INTRAMUSCULAR | Status: AC
  Filled 2016-02-09: qty 1

## 2016-02-09 MED ORDER — FENTANYL CITRATE (PF) 250 MCG/5ML IJ SOLN
INTRAMUSCULAR | Status: AC
  Filled 2016-02-09: qty 5

## 2016-02-09 MED ORDER — LACTATED RINGERS IV SOLN
INTRAVENOUS | Status: DC
  Administered 2016-02-09 (×3): via INTRAVENOUS

## 2016-02-09 MED ORDER — SUGAMMADEX SODIUM 200 MG/2ML IV SOLN
INTRAVENOUS | Status: AC
Start: 2016-02-09 — End: 2016-02-09
  Filled 2016-02-09: qty 2

## 2016-02-09 MED ORDER — SUGAMMADEX SODIUM 200 MG/2ML IV SOLN
INTRAVENOUS | Status: DC | PRN
  Administered 2016-02-09: 200 mg via INTRAVENOUS

## 2016-02-09 MED ORDER — ROCURONIUM BROMIDE 100 MG/10ML IV SOLN
INTRAVENOUS | Status: AC
  Filled 2016-02-09: qty 1

## 2016-02-09 MED ORDER — PROPOFOL 10 MG/ML IV BOLUS
INTRAVENOUS | Status: DC | PRN
  Administered 2016-02-09: 170 mg via INTRAVENOUS

## 2016-02-09 MED ORDER — BUPIVACAINE HCL 0.5 % IJ SOLN
INTRAMUSCULAR | Status: DC | PRN
  Administered 2016-02-09: 16 mL

## 2016-02-09 MED ORDER — IBUPROFEN 200 MG PO TABS: 600.0000 mg | ORAL_TABLET | Freq: Four times a day (QID) | ORAL | 1 refills | Status: DC | PRN

## 2016-02-09 MED ORDER — IBUPROFEN 100 MG/5ML PO SUSP
200.0000 mg | Freq: Four times a day (QID) | ORAL | Status: DC | PRN
  Filled 2016-02-09: qty 20

## 2016-02-09 MED ORDER — KETOROLAC TROMETHAMINE 30 MG/ML IJ SOLN: 30.0000 mg | Freq: Once | INTRAMUSCULAR | Status: DC

## 2016-02-09 MED ORDER — KETOROLAC TROMETHAMINE 30 MG/ML IJ SOLN
INTRAMUSCULAR | Status: DC | PRN
  Administered 2016-02-09: 30 mg via INTRAVENOUS

## 2016-02-09 MED ORDER — NORETHINDRONE ACETATE 5 MG PO TABS: 5.0000 mg | ORAL_TABLET | Freq: Every day | ORAL | 6 refills | Status: DC

## 2016-02-09 MED ORDER — DOCUSATE SODIUM 100 MG PO CAPS: 100.0000 mg | ORAL_CAPSULE | Freq: Two times a day (BID) | ORAL | 0 refills | Status: DC

## 2016-02-09 MED ORDER — OXYCODONE-ACETAMINOPHEN 5-325 MG PO TABS: 1.0000 | ORAL_TABLET | Freq: Four times a day (QID) | ORAL | 0 refills | Status: DC | PRN

## 2016-02-09 MED ORDER — ONDANSETRON HCL 4 MG/2ML IJ SOLN
4.0000 mg | Freq: Once | INTRAMUSCULAR | Status: AC | PRN
  Administered 2016-02-09: 4 mg via INTRAVENOUS

## 2016-02-09 MED ORDER — BUPIVACAINE HCL (PF) 0.5 % IJ SOLN
INTRAMUSCULAR | Status: AC
  Filled 2016-02-09: qty 30

## 2016-02-09 MED ORDER — SILVER NITRATE-POT NITRATE 75-25 % EX MISC
CUTANEOUS | Status: DC | PRN
  Administered 2016-02-09: 2

## 2016-02-09 MED ORDER — MIDAZOLAM HCL 2 MG/2ML IJ SOLN
INTRAMUSCULAR | Status: AC
  Filled 2016-02-09: qty 2

## 2016-02-09 SURGICAL SUPPLY — 57 items
APPLICATOR COTTON TIP 6IN STRL (MISCELLANEOUS) ×10 IMPLANT
BARRIER ADHS 3X4 INTERCEED (GAUZE/BANDAGES/DRESSINGS) IMPLANT
BENZOIN TINCTURE PRP APPL 2/3 (GAUZE/BANDAGES/DRESSINGS) IMPLANT
CABLE HIGH FREQUENCY MONO STRZ (ELECTRODE) ×5 IMPLANT
CANISTER SUCT 3000ML (MISCELLANEOUS) ×5 IMPLANT
CLOSURE WOUND 1/2 X4 (GAUZE/BANDAGES/DRESSINGS)
CLOTH BEACON ORANGE TIMEOUT ST (SAFETY) ×5 IMPLANT
CONT PATH 16OZ SNAP LID 3702 (MISCELLANEOUS) IMPLANT
DECANTER SPIKE VIAL GLASS SM (MISCELLANEOUS) ×5 IMPLANT
DRAPE WARM FLUID 44X44 (DRAPE) IMPLANT
DRSG COVADERM PLUS 2X2 (GAUZE/BANDAGES/DRESSINGS) ×10 IMPLANT
DRSG OPSITE POSTOP 3X4 (GAUZE/BANDAGES/DRESSINGS) ×5 IMPLANT
DRSG OPSITE POSTOP 4X10 (GAUZE/BANDAGES/DRESSINGS) IMPLANT
DURAPREP 26ML APPLICATOR (WOUND CARE) ×5 IMPLANT
ELECT REM PT RETURN 9FT ADLT (ELECTROSURGICAL) ×5
ELECTRODE REM PT RTRN 9FT ADLT (ELECTROSURGICAL) ×3 IMPLANT
GAUZE SPONGE 4X4 16PLY XRAY LF (GAUZE/BANDAGES/DRESSINGS) IMPLANT
GLOVE BIO SURGEON STRL SZ7 (GLOVE) ×10 IMPLANT
GLOVE BIOGEL PI IND STRL 7.0 (GLOVE) ×6 IMPLANT
GLOVE BIOGEL PI IND STRL 7.5 (GLOVE) ×6 IMPLANT
GLOVE BIOGEL PI INDICATOR 7.0 (GLOVE) ×4
GLOVE BIOGEL PI INDICATOR 7.5 (GLOVE) ×4
GLOVE INDICATOR 7.5 STRL GRN (GLOVE) ×5 IMPLANT
GOWN STRL REUS W/TWL LRG LVL3 (GOWN DISPOSABLE) ×20 IMPLANT
IV NS IRRIG 3000ML ARTHROMATIC (IV SOLUTION) ×5 IMPLANT
LIGASURE BLUNT 5MM 37CM (INSTRUMENTS) ×5 IMPLANT
LIQUID BAND (GAUZE/BANDAGES/DRESSINGS) ×5 IMPLANT
NEEDLE HYPO 22GX1.5 SAFETY (NEEDLE) IMPLANT
NS IRRIG 1000ML POUR BTL (IV SOLUTION) ×5 IMPLANT
PACK ABDOMINAL GYN (CUSTOM PROCEDURE TRAY) IMPLANT
PACK LAPAROSCOPY BASIN (CUSTOM PROCEDURE TRAY) ×5 IMPLANT
PAD OB MATERNITY 4.3X12.25 (PERSONAL CARE ITEMS) ×5 IMPLANT
PAD TRENDELENBURG POSITION (MISCELLANEOUS) ×5 IMPLANT
PENCIL SMOKE EVAC W/HOLSTER (ELECTROSURGICAL) IMPLANT
POUCH SPECIMEN RETRIEVAL 10MM (ENDOMECHANICALS) IMPLANT
PROTECTOR NERVE ULNAR (MISCELLANEOUS) IMPLANT
SCISSORS LAP 5X35 DISP (ENDOMECHANICALS) IMPLANT
SET IRRIG TUBING LAPAROSCOPIC (IRRIGATION / IRRIGATOR) ×5 IMPLANT
SLEEVE XCEL OPT CAN 5 100 (ENDOMECHANICALS) ×5 IMPLANT
SPONGE LAP 18X18 X RAY DECT (DISPOSABLE) IMPLANT
STAPLER VISISTAT 35W (STAPLE) IMPLANT
STRIP CLOSURE SKIN 1/2X4 (GAUZE/BANDAGES/DRESSINGS) IMPLANT
SUT MON AB 4-0 PS1 27 (SUTURE) ×5 IMPLANT
SUT VIC AB 0 CT1 27 (SUTURE) ×2
SUT VIC AB 0 CT1 27XBRD ANBCTR (SUTURE) ×3 IMPLANT
SUT VIC AB 0 CT1 36 (SUTURE) IMPLANT
SUT VIC AB 4-0 PS2 18 (SUTURE) ×5 IMPLANT
SUT VICRYL 0 UR6 27IN ABS (SUTURE) ×5 IMPLANT
SYR CONTROL 10ML LL (SYRINGE) IMPLANT
SYRINGE 10CC LL (SYRINGE) ×5 IMPLANT
TOWEL OR 17X24 6PK STRL BLUE (TOWEL DISPOSABLE) ×15 IMPLANT
TRAY FOLEY CATH SILVER 14FR (SET/KITS/TRAYS/PACK) ×5 IMPLANT
TROCAR BALLN 12MMX100 BLUNT (TROCAR) ×5 IMPLANT
TROCAR OPTI TIP 5M 100M (ENDOMECHANICALS) ×5 IMPLANT
TROCAR XCEL NON-BLD 11X100MML (ENDOMECHANICALS) IMPLANT
TROCAR XCEL NON-BLD 5MMX100MML (ENDOMECHANICALS) ×5 IMPLANT
WARMER LAPAROSCOPE (MISCELLANEOUS) ×5 IMPLANT

## 2016-02-09 NOTE — Discharge Instructions (Addendum)
Laparoscopic Surgery Discharge Instructions  Instructions Following Laparoscopic Surgery You have just undergone a laparoscopic surgery.  The following list should answer your most common questions.  Although we will discuss your surgery and post-operative instructions with you prior to your discharge, this list will serve as a reminder if you fail to recall the details of what we discussed.  We will discuss your surgery once again in detail at your post-op visit in two to four weeks. If you havent already done so, please call to make your appointment as soon as possible.  How you will feel: Although you have just undergone a major surgery, your recovery will be significantly shorter since the surgery was performed through much smaller incisions than the traditional approach.  You should feel slightly better each day.  If you suddenly feel much worse than the prior day, please call the clinic.  Its important during the early part of your recovery that you maintain some activity.  Walking is encouraged.  You will quicken your recovery by continued activity.  Incision:  Your incisions will be closed with dissolvable stitches or surgical adhesive (glue).  There may be Band-aids and/or Steri-strips covering your incisions.  If there is no drainage from the incisions you may remove the Band-aids in one to two days.  You may notice some minor bruising at the incision sites.  This is common and will resolve within several days.  Please inform us if the redness at the edges of your incision appears to be spreading.  If the skin around your incision becomes warm to the touch, or if you notice a pus-like drainage, please call the office.  Stairs/Driving/Activities: You may climb stairs if necessary.  If youve had general anesthesia, do not drive a car the rest of the day today.  You may begin light housework when you feel up to it, but avoid heavy lifting (more than 15-20lbs) or pushing until cleared for these  activities by your physician.  Hygiene:  Do not soak your incisions.  Showers are acceptable but you may not take a bath or swim in a pool.  Cleanse your incisions daily with soap and water.  Medications:  Please resume taking any medications that you were taking prior to the surgery.  If we have prescribed any new medications for you, please take them as directed.  Constipation:  It is fairly common to experience some difficulty in moving your bowels following major surgery.  Being active will help to reduce this likelihood. A diet rich in fiber and plenty of liquids is desirable.  If you do become constipated, a mild laxative such as Miralax, Milk of Magnesia, or Metamucil, or a stool softener such as Colace, is recommended.  General Instructions: If you develop a fever of 100.5 degrees or higher, please call the office number(s) below for physician on call.    Diet: Eat a light meal as desired this evening. You may resume your usual diet tomorrow.  What to expect after your surgery: You may have a slight burning sensation when you urinate on the first day. You may have a very small amount of blood in the urine. Expect to have a small amount of vaginal discharge/light bleeding for 1-2 weeks. It is not unusual to have abdominal soreness and bruising for up to 2 weeks. You may be tired and need more rest for about 1 week. You may experience shoulder pain for 24-72 hours. Lying flat in bed may relieve it.  Call your doctor  for any of the following:  Develop a fever of 100.5 or greater  Inability to urinate 6 hours after discharge from hospital  Severe pain not relieved by pain medications  Persistent of heavy bleeding at incision site  Redness or swelling around incision site after a week  Increasing nausea or vomiting  You may begin ibuprofen at 2:30pm 02/09/16.  You may remove the patch behind your ear on or before Friday 02/12/16. Wash your hands with soap and water after any contact  with the patch.

## 2016-02-09 NOTE — Op Note (Signed)
Operative Note   02/09/2016  PRE-OP DIAGNOSIS *Right adnexal mass *Fibroids *Abdominal pain   POST-OP DIAGNOSIS *Same *Severe pelvic adhesive disease *Stage IV endometriosis   SURGEON: Surgeon(s) and Role:    * Aletha Halim, MD - Primary  ASSISTANT: Clovia Cuff, MD  PROCEDURE: Diagnostic laparoscopy, lysis of adhesions <45 minutes  ANESTHESIA: General and local  ESTIMATED BLOOD LOSS: 31mL  DRAINS: indwelling foley 59mL UOP   TOTAL IV FLUIDS: 1647mL crystalloid  VTE PROPHYLAXIS: SCDs to the bilateral lower extremities  ANTIBIOTICS: not indicated  SPECIMENS: none  DISPOSITION: PACU - hemodynamically stable.  CONDITION: stable  COMPLICATIONS: None  FINDINGS: On diagnostic laparoscopy, there was a normal appearing left ovary and fallopian tube. On the right side, the tube appeared to be normal vs ?slightly dilated with the right ovary encased in an endometrioma like sac, scar tissue, and adhesions and stuck in the posterior cul de sac; there was also chocolate fluid in this area, and there was large bowel stuck to this complex.  The uterus was somewhat mobile with moderate ability to lift to the anterior abdominal wall.  Anteriorly, the omentum was stuck to the area of the anterior cul de sac. After lysis of adhesions with the Ligasure, the foley bulb wasn't able to be discerned and there was an area overlapping the bladder that had dark fluid cyst like material in it and felt hard with question of endometriosis with or without a fibroid in this peritoneal area.  Also, right lateral to this area that was an adjacent but separate possible peritoneal fibroid.  On the uterus itself, there was a 3-4cm subserosal fibroid on the posterior left fundal area, near the cornua.  There was also an endometriotic implant on the left pelvic side wall at the area of the pelvic brim.  Stenotic cervix and unable to pass a uterine sound. Normal gallbladder, liver and stomach edge. Normal omentum and  appendix   DESCRIPTION OF PROCEDURE: After informed consent was obtained, the patient was taken to the operating room where anesthesia was obtained without difficulty. The patient was positioned in the dorsal lithotomy position in [place] and her arms were carefully tucked at her sides and the usual precautions were taken.  She was prepped and draped in normal sterile fashion.  Time-out was performed and a Foley catheter was placed into the bladder. A Hulksa uterine manipulator was then placed in the uterus without incident, given the above noted cervical stenosis. Gloves were then changed, and after injection of local anesthesia and confirmation of gastric suction, a 61mm palmer's point incision was made in the LUQ and using the Veress technique with negative hanging drop test and opening pressure of 30mmHg the abdomen was insufflated. Next, using the trocar and laparoscope, the abdomen was entered and the area below inspected and no injury noted. The patient was then placed in Trendelenburg, and a RLQ and LLQ 71mm port were placed under direct visualization, with the above noted findings.   The RLQ and LLQ ports were removed under directed visualization and the gas released and the LUQ port removed; this area was closed with 4-0 vicryl subcuticular stitch and dermabond and the other port sites closed with dermabond. The remaining skin incisions were closed with Indermil glue.  The patient tolerated the procedure well.  Sponge, lap and needle counts were correct x2.  The patient was taken to recovery room in excellent condition.  She will be put on Aygestin 5mg  PO daily suppression until further discussion in clinic  regarding her fertility goals are discussed more in depth.   Durene Romans MD Attending Center for [employer] [facility])
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
## 2016-02-09 NOTE — Transfer of Care (Signed)
Immediate Anesthesia Transfer of Care Note  Patient: Katie Ward  Procedure(s) Performed: Procedure(s): LAPAROSCOPY DIAGNOSTIC (N/A)  Patient Location: PACU  Anesthesia Type:General  Level of Consciousness: sedated  Airway & Oxygen Therapy: Patient Spontanous Breathing and Patient connected to nasal cannula oxygen  Post-op Assessment: Report given to RN  Post vital signs: Reviewed and stable  Last Vitals:  Vitals:   02/09/16 0609  BP: 134/84  Pulse: (!) 104  Resp: 18  Temp: 36.5 C    Last Pain:  Vitals:   02/09/16 0609  TempSrc: Oral      Patients Stated Pain Goal: 3 ([id number])  Complications: No apparent anesthesia complications

## 2016-02-09 NOTE — Anesthesia Preprocedure Evaluation (Signed)
Anesthesia Evaluation  Patient identified by MRN, date of birth, ID band Patient awake    Reviewed: Allergy & Precautions, H&P , NPO status , Patient's Chart, lab work & pertinent test results  Airway Mallampati: II  TM Distance: >3 FB Neck ROM: full    Dental  (+) Teeth Intact   Pulmonary neg pulmonary ROS,    Pulmonary exam normal        Cardiovascular Normal cardiovascular exam Rhythm:regular Rate:Normal     Neuro/Psych negative neurological ROS  negative psych ROS   GI/Hepatic negative GI ROS, Neg liver ROS,   Endo/Other  negative endocrine ROS  Renal/GU negative Renal ROS     Musculoskeletal   Abdominal Normal abdominal exam  (+)   Peds  Hematology negative hematology ROS (+)   Anesthesia Other Findings   Reproductive/Obstetrics negative OB ROS                             Anesthesia Physical Anesthesia Plan  ASA: II  Anesthesia Plan: General   Post-op Pain Management:    Induction: Intravenous  Airway Management Planned: Oral ETT  Additional Equipment:   Intra-op Plan:   Post-operative Plan: Extubation in OR  Informed Consent: I have reviewed the patients History and Physical, chart, labs and discussed the procedure including the risks, benefits and alternatives for the proposed anesthesia with the patient or authorized representative who has indicated his/her understanding and acceptance.   Dental Advisory Given  Plan Discussed with: CRNA  Anesthesia Plan Comments:         Anesthesia Quick Evaluation

## 2016-02-09 NOTE — Anesthesia Postprocedure Evaluation (Signed)
Anesthesia Post Note  Patient: Katie Ward  Procedure(s) Performed: Procedure(s) (LRB): LAPAROSCOPY DIAGNOSTIC (N/A)  Patient location during evaluation: PACU Anesthesia Type: General Level of consciousness: awake Pain management: pain level controlled Vital Signs Assessment: post-procedure vital signs reviewed and stable Respiratory status: spontaneous breathing Cardiovascular status: blood pressure returned to baseline Anesthetic complications: no     Last Vitals:  Vitals:   02/09/16 1015 02/09/16 1030  BP: 113/67 125/66  Pulse: 83 88  Resp: 15 19  Temp:      Last Pain:  Vitals:   02/09/16 0609  TempSrc: Oral   Pain Goal: Patients Stated Pain Goal: 3 (02/09/16 0609)               Tomeko Scoville JR,JOHN Mateo Flow

## 2016-02-09 NOTE — Anesthesia Procedure Notes (Addendum)
Procedure Name: Intubation Date/Time: 02/09/2016 7:21 AM Performed by: Casimer Lanius A Pre-anesthesia Checklist: Patient identified, Emergency Drugs available, Suction available and Patient being monitored Patient Re-evaluated:Patient Re-evaluated prior to inductionOxygen Delivery Method: Circle system utilized and Simple face mask Preoxygenation: Pre-oxygenation with 100% oxygen Intubation Type: IV induction and Inhalational induction Ventilation: Mask ventilation without difficulty Laryngoscope Size: Mac and 3 Grade View: Grade II Tube type: Oral Tube size: 7.0 mm Number of attempts: 1 Airway Equipment and Method: Stylet and Patient positioned with wedge pillow Placement Confirmation: ETT inserted through vocal cords under direct vision,  positive ETCO2 and breath sounds checked- equal and bilateral Secured at: 21 (right lip) cm Tube secured with: Tape Dental Injury: Teeth and Oropharynx as per pre-operative assessment

## 2016-02-10 ENCOUNTER — Telehealth: Payer: Self-pay | Admitting: *Deleted

## 2016-02-10 NOTE — Telephone Encounter (Signed)
Received message left on nurse voicemail on 02/09/16 at 1142.  Kapi from [facility] states she is calling to obtain pre-auth for surgery that is currently being performed.  Surgery was outpatient with Dr. Ilda Basset.  CPT codes [id number], [id number], [id number], [phone] (combo salping with ooph), [id number] and [id number].  Requests a return call with pre-authorization to [phone].

## 2016-02-11 ENCOUNTER — Encounter (HOSPITAL_COMMUNITY): Payer: Self-pay | Admitting: Obstetrics and Gynecology

## 2016-02-15 ENCOUNTER — Encounter: Payer: Self-pay | Admitting: Obstetrics and Gynecology

## 2016-02-15 ENCOUNTER — Telehealth: Payer: Self-pay | Admitting: Obstetrics and Gynecology

## 2016-02-15 NOTE — Telephone Encounter (Signed)
GYN Telephone Note 02/15/2016   Pt called at [phone] to check up on her and go over intraoperative findings. VM left to call the clinic if any questions or issues and to keep taking the Aygestin until I see her back in clinic to go over next steps.  Durene Romans MD Attending Center for [employer])

## 2016-03-10 ENCOUNTER — Ambulatory Visit (INDEPENDENT_AMBULATORY_CARE_PROVIDER_SITE_OTHER): Payer: 59 | Admitting: Obstetrics and Gynecology

## 2016-03-10 VITALS — BP 133/70 | HR 88 | Wt 222.8 lb

## 2016-03-10 DIAGNOSIS — R350 Frequency of micturition: Secondary | ICD-10-CM

## 2016-03-10 DIAGNOSIS — N809 Endometriosis, unspecified: Secondary | ICD-10-CM
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
## 2016-03-11 LAB — URINE CULTURE: Organism ID, Bacteria: 10000

## 2016-03-17 ENCOUNTER — Ambulatory Visit (HOSPITAL_COMMUNITY)
Admission: RE | Admit: 2016-03-17 | Discharge: 2016-03-17 | Disposition: A | Discharge status: Home / Self Care | Payer: 59 | Source: Ambulatory Visit | Attending: Obstetrics and Gynecology | Admitting: Obstetrics and Gynecology

## 2016-03-17 DIAGNOSIS — R938 Abnormal findings on diagnostic imaging of other specified body structures: Secondary | ICD-10-CM | POA: Diagnosis not present

## 2016-03-17 DIAGNOSIS — N809 Endometriosis, unspecified: Secondary | ICD-10-CM | POA: Diagnosis not present

## 2016-03-17 DIAGNOSIS — R1909 Other intra-abdominal and pelvic swelling, mass and lump: Secondary | ICD-10-CM | POA: Insufficient documentation

## 2016-03-17 DIAGNOSIS — D259 Leiomyoma of uterus, unspecified: Secondary | ICD-10-CM | POA: Diagnosis not present

## 2016-03-28 ENCOUNTER — Telehealth: Payer: Self-pay | Admitting: Obstetrics and Gynecology

## 2016-03-28 DIAGNOSIS — R1900 Intra-abdominal and pelvic swelling, mass and lump, unspecified site: Secondary | ICD-10-CM

## 2016-03-28 NOTE — Addendum Note (Signed)
Addended by: Aletha Halim on: 03/28/2016 12:15 PM   Modules accepted: Orders

## 2016-03-28 NOTE — Telephone Encounter (Signed)
D/w Ms. Brettschneider re: u/s findings and complex appearing and looks bigger. Based on this, she is amenable to GYN Onc referral in order to evaluate this and d/w her re: next steps, whether that be continued exp management on aygestin, changing medical management or if they feel that moving towards surgery with them is needed based on radiological findings. Pt continues to be asymptomatic and doing well on the aygestin.   Durene Romans MD Attending Center for [employer])

## 2016-04-06 ENCOUNTER — Encounter: Payer: Self-pay | Admitting: Obstetrics and Gynecology

## 2016-04-13 ENCOUNTER — Telehealth: Payer: Self-pay

## 2016-04-13 ENCOUNTER — Encounter: Payer: Self-pay | Admitting: Obstetrics and Gynecology

## 2016-04-13 NOTE — Telephone Encounter (Signed)
Appointment has been made for patient to go to GYN ONCOLOGY on 04/20/2016@2 :00. I have left a message for patient to call me back regarding information. Appointment reminder will be sent out to patient.

## 2016-04-20 ENCOUNTER — Ambulatory Visit: Payer: 59 | Attending: Gynecologic Oncology | Admitting: Gynecologic Oncology

## 2016-04-20 ENCOUNTER — Encounter: Payer: Self-pay | Admitting: Gynecologic Oncology

## 2016-04-20 VITALS — BP 133/81 | HR 94 | Temp 98.2°F | Resp 18 | Ht 64.0 in | Wt 224.7 lb

## 2016-04-20 DIAGNOSIS — N809 Endometriosis, unspecified: Secondary | ICD-10-CM | POA: Diagnosis not present

## 2016-04-20 DIAGNOSIS — Z833 Family history of diabetes mellitus: Secondary | ICD-10-CM | POA: Diagnosis not present

## 2016-04-20 DIAGNOSIS — Z793 Long term (current) use of hormonal contraceptives: Secondary | ICD-10-CM | POA: Insufficient documentation

## 2016-04-20 DIAGNOSIS — Z8249 Family history of ischemic heart disease and other diseases of the circulatory system: Secondary | ICD-10-CM | POA: Diagnosis not present

## 2016-04-20 DIAGNOSIS — R1900 Intra-abdominal and pelvic swelling, mass and lump, unspecified site: Secondary | ICD-10-CM
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
## 2016-04-22 ENCOUNTER — Telehealth: Payer: Self-pay | Admitting: Gynecologic Oncology

## 2016-04-22 NOTE — Telephone Encounter (Signed)
I discussed the patient's case with Dr. Eliott Nine from [facility]. She felt it would be reasonable to delay surgery until the patient has attempted 3-6 months of conception. If she is not pregnant after that, then she would recommend proceeding with surgery.   I called the patient to discuss this and she did not answer. VM left.

## 2016-04-25 ENCOUNTER — Ambulatory Visit: Payer: 59 | Admitting: Obstetrics and Gynecology

## 2016-04-27 ENCOUNTER — Telehealth: Payer: Self-pay | Admitting: Gynecologic Oncology

## 2016-04-27 NOTE — Telephone Encounter (Signed)
I spoke with the patient about Dr. Tomie China [facility]) recommendation for attempted pregnancy for 3-6 months before resecting her endometrioma given uncertain impact on fertility. If after 3-6 months of attempted conception she is not pregnant she is instructed to call our office for attempted surgical resection.

## 2016-07-09 ENCOUNTER — Encounter: Payer: Self-pay | Admitting: Obstetrics and Gynecology

## 2016-09-12 IMAGING — CT CT ABD-PELV W/ CM
2 of 4 series · 16 of 46 positions shown, 18 images · IV contrast (APPLIED)
Comparison: None.

CLINICAL DATA: Lower abdominal and pelvic pain. The pain began
after a bowel movement 12 hours ago.

EXAM:
CT ABDOMEN AND PELVIS WITH CONTRAST
TECHNIQUE: Multidetector CT imaging of the abdomen and pelvis was performed
using the standard protocol following bolus administration of
intravenous contrast.
CONTRAST:  100mL 18FYNO-G00 IOPAMIDOL (18FYNO-G00) INJECTION 61%

[Series 2: axial st · axial · 0.88mm/px · z∈[-517,-87]mm · 13 of 94 slices shown, 15 images]
[im 4/94  soft-tissue]
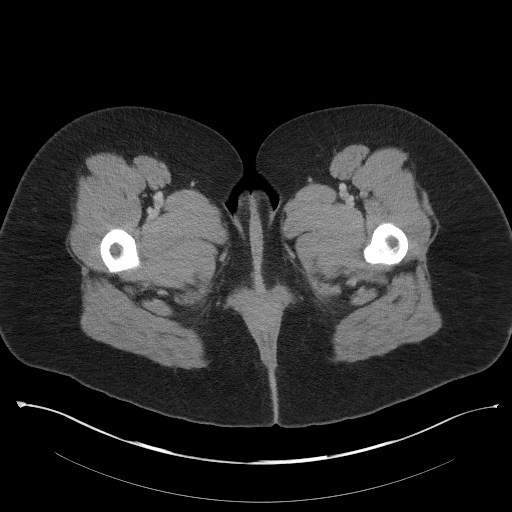
[im 4/94  bone]
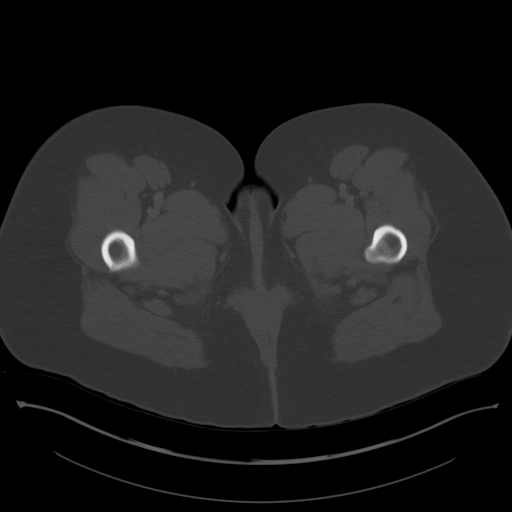
[im 12/94  soft-tissue]
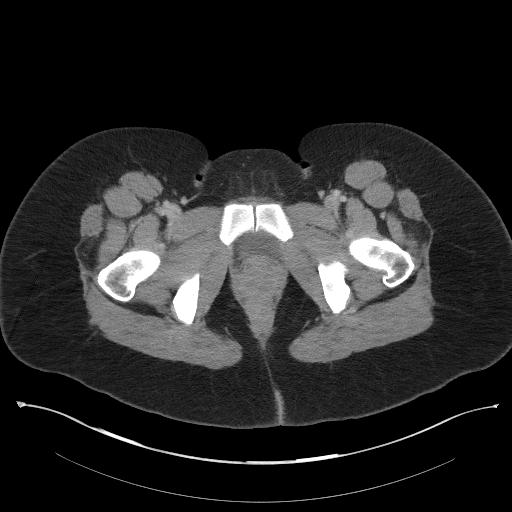
[im 19/94  soft-tissue]
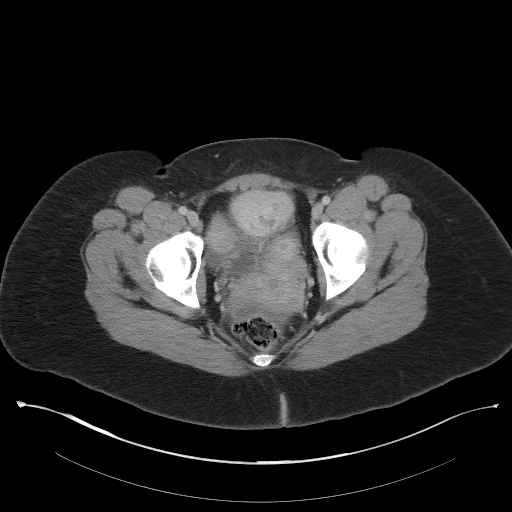
[im 27/94  soft-tissue]
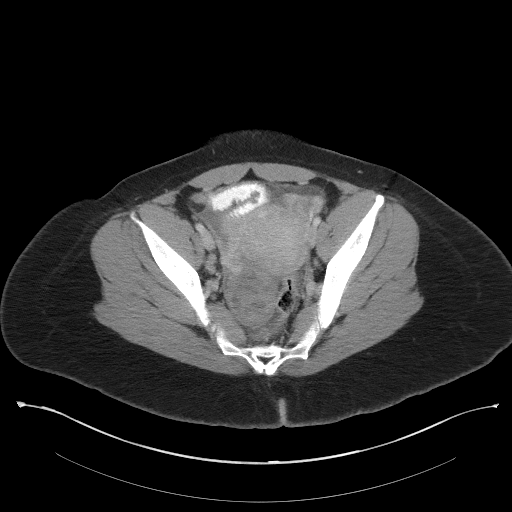
[im 34/94  soft-tissue]
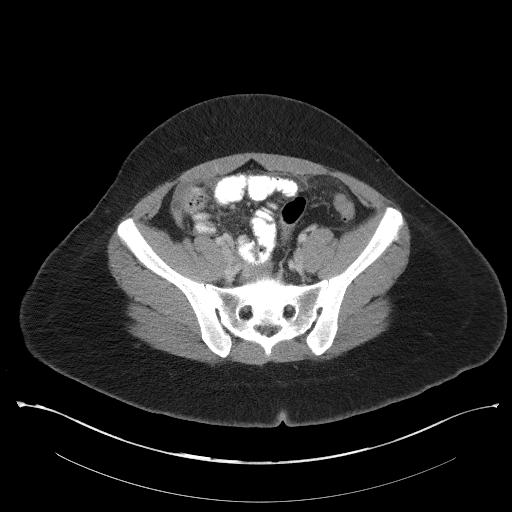
[im 41/94  soft-tissue]
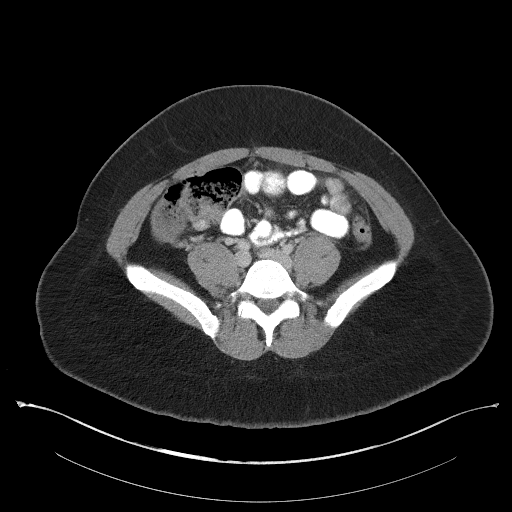
[im 49/94  soft-tissue]
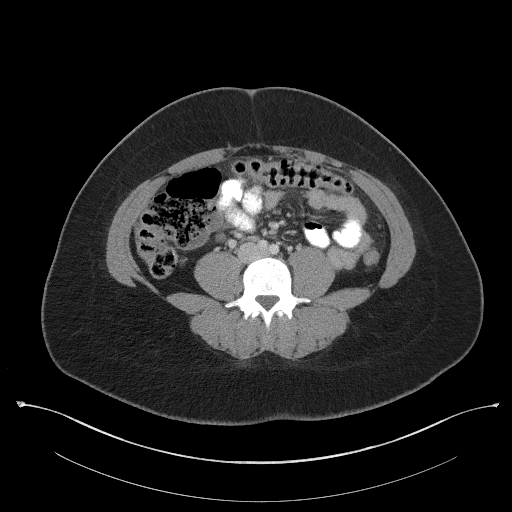
[im 53/94  soft-tissue]
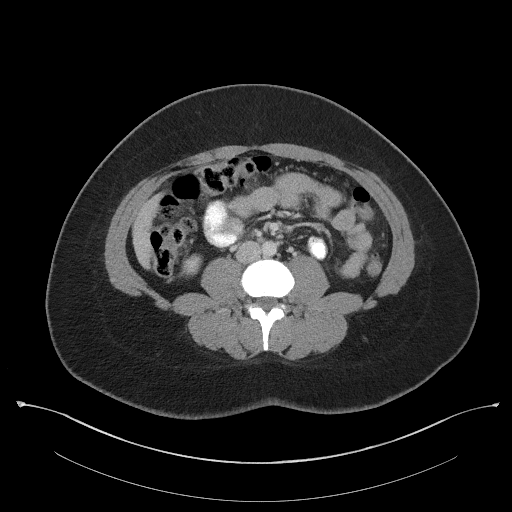
[im 60/94  soft-tissue]
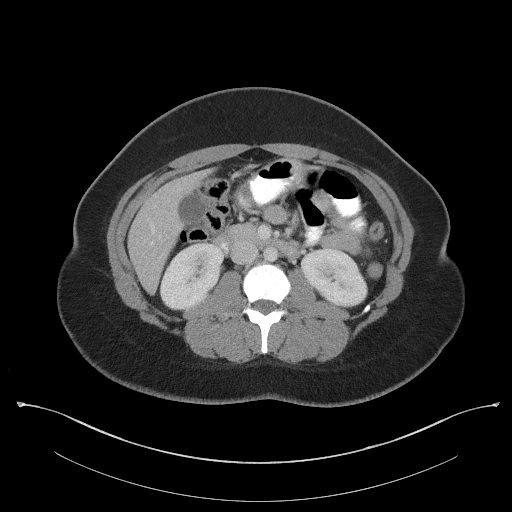
[im 60/94  bone]
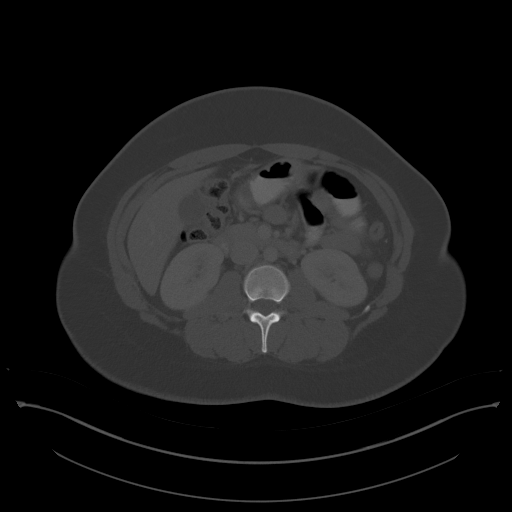
[im 67/94  soft-tissue]
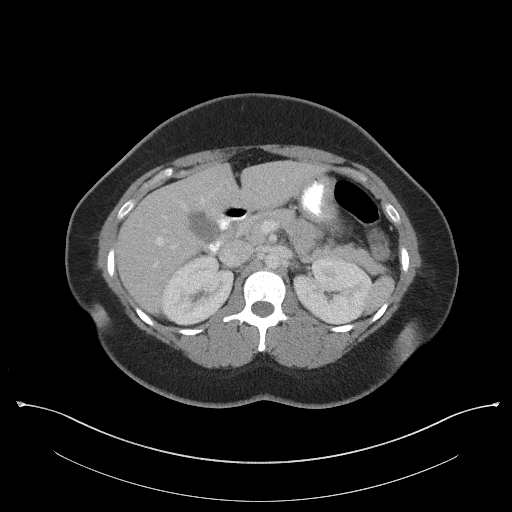
[im 75/94  soft-tissue]
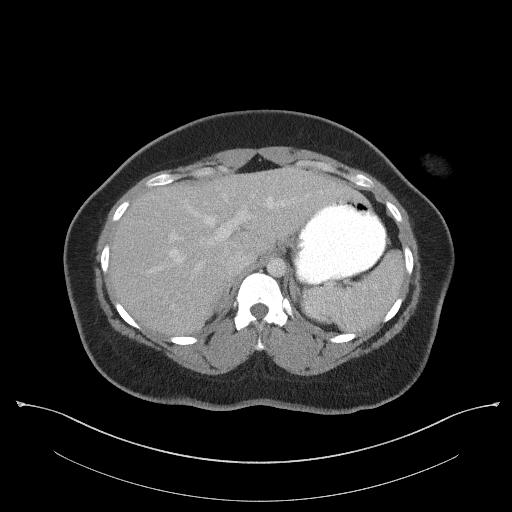
[im 82/94  soft-tissue]
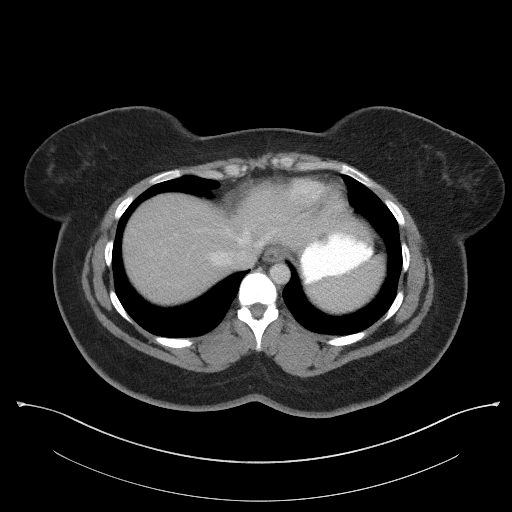
[im 90/94  soft-tissue]
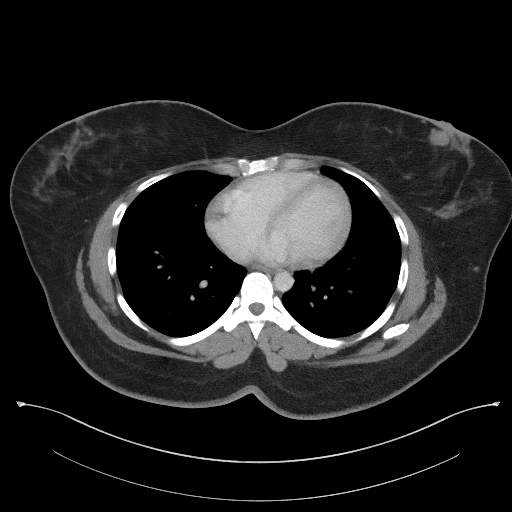

[Series 5: coronal st · coronal · 0.78mm/px · 3 of 81 slices shown]
[im 27/81  soft-tissue]
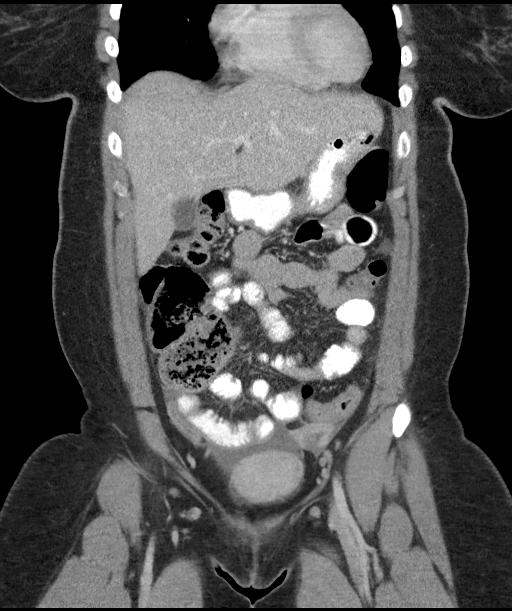
[im 36/81  soft-tissue]
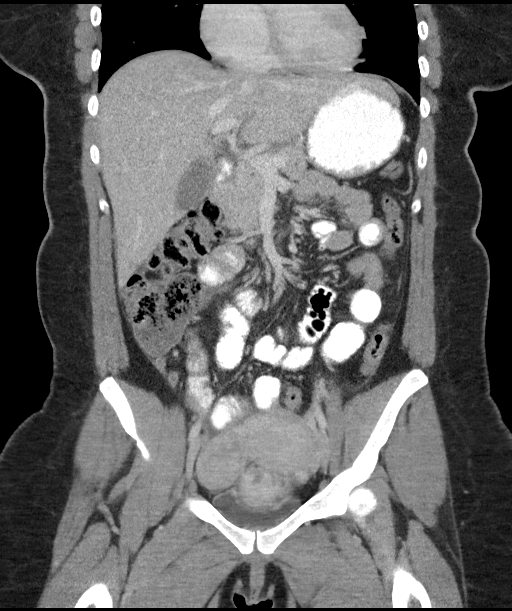
[im 45/81  soft-tissue]
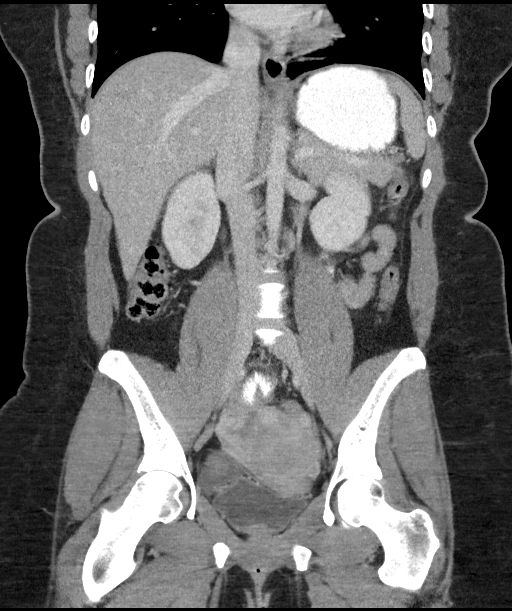

[16 of 46 positions shown; findings below may reference images not displayed]

FINDINGS: Lower chest: The lung bases are clear without focal nodule mass or
airspace disease. The heart size is normal. No significant pleural
or pericardial effusion is present.

Hepatobiliary: The liver, gallbladder, and the common bile duct are
within normal limits.

Pancreas: Unremarkable

Spleen: Within normal limits

Adrenals/Urinary Tract: The adrenal glands are normal bilaterally.
Kidneys and ureters are within normal limits. The urinary bladder is
mostly collapsed.

Stomach/Bowel: The stomach and duodenum are within normal limits.
The small bowel is unremarkable. The appendix and cecum are
visualized and normal. The ascending and transverse colon are within
normal limits. The descending and rectosigmoid colon are
unremarkable. There is some stranding within the mesentery
throughout the abdomen.

Vascular/Lymphatic: No significant atherosclerotic calcifications,
stenosis, or aneurysm.

Reproductive: 2 large exophytic fibroids are present at the uterine
fundus. The largest measures 5.7 cm. Multiple additional fibroids
are present throughout the uterus.

A low-density right adnexal mass measures 4.4 x 5.4 x 6.5 cm. A
moderate amount of free fluid is present within the anatomic pelvis.
The left ovary is visualized and within normal limits.

Other: A moderate amount of free fluid is present in the anatomic
pelvis. The fluid is of intermediate density measuring approximately
30 Hounsfield units.

Musculoskeletal: Bone windows are unremarkable. No focal lytic or
blastic lesions are present.
IMPRESSION: 1. 6.5 cm low-density right adnexal mass. The differential diagnosis
includes torsion of the ovary, tubo-ovarian abscess, or less likely
neoplasm. Recommend pelvic ultrasound with Doppler examination for
further evaluation.
2. Fibroid uterus.  This could be a separate source of pain.
3. High-density pelvic fluid. This raises concern for infection or
hemorrhage. No active hemorrhage is evident.
4. The bowel appears to be within normal limits.
These results were called by telephone at the time of interpretation
on 10/09/2015 at [DATE] to Dr. CAMINADA, who verbally acknowledged
these results.

## 2017-02-28 ENCOUNTER — Other Ambulatory Visit: Payer: Self-pay

## 2017-02-28 MED ORDER — NORETHINDRONE ACETATE 5 MG PO TABS: 5.0000 mg | ORAL_TABLET | Freq: Every day | ORAL | 0 refills | Status: DC

## 2017-04-10 ENCOUNTER — Encounter: Payer: Self-pay | Admitting: Obstetrics and Gynecology

## 2017-04-10 ENCOUNTER — Ambulatory Visit (INDEPENDENT_AMBULATORY_CARE_PROVIDER_SITE_OTHER): Payer: 59 | Admitting: Obstetrics and Gynecology

## 2017-04-10 VITALS — BP 120/81 | HR 90 | Ht 64.0 in | Wt 218.4 lb

## 2017-04-10 DIAGNOSIS — Z113 Encounter for screening for infections with a predominantly sexual mode of transmission: Secondary | ICD-10-CM

## 2017-04-10 DIAGNOSIS — Z1151 Encounter for screening for human papillomavirus (HPV): Secondary | ICD-10-CM

## 2017-04-10 DIAGNOSIS — Z01419 Encounter for gynecological examination (general) (routine) without abnormal findings: Secondary | ICD-10-CM | POA: Diagnosis not present

## 2017-04-10 DIAGNOSIS — Z124 Encounter for screening for malignant neoplasm of cervix: Secondary | ICD-10-CM | POA: Diagnosis not present

## 2017-04-10 NOTE — Patient Instructions (Signed)
Make sure you are taking 0.4 to 1mg  of folic acid daily at least two months before trying to conceive  When you decide you want to conceive, stop the pill and you can expect a period sometime in the next few days to a month after stopping the pill.  The first day of your period is day one. Count to day 10 and on day 10, start having sex every other day for a week. If you are a week or two late, take a home pregnancy test, and if positive call us for a visit.  If you have your period, repeat this for 3-6 months, and if you haven't gotten pregnant yet, call us to let us know.
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
## 2017-04-11 LAB — HEPATITIS C ANTIBODY: Hep C Virus Ab: <0.1 s/co ratio (ref 0.0–0.9)

## 2017-04-11 LAB — HIV ANTIBODY (ROUTINE TESTING W REFLEX): HIV Screen 4th Generation wRfx: NONREACTIVE

## 2017-04-11 LAB — HEPATITIS B SURFACE ANTIGEN: Hepatitis B Surface Ag: NEGATIVE

## 2017-04-11 LAB — TSH: TSH: 1.65 u[IU]/mL (ref 0.450–4.500)

## 2017-04-11 LAB — RPR: RPR Ser Ql: NONREACTIVE

## 2017-04-12 LAB — CYTOLOGY - PAP
Chlamydia: NEGATIVE
Diagnosis: NEGATIVE
HPV (WINDOPATH): NOT DETECTED
NEISSERIA GONORRHEA: NEGATIVE
TRICH (WINDOWPATH): NEGATIVE

## 2017-05-01 ENCOUNTER — Other Ambulatory Visit: Payer: Self-pay | Admitting: Obstetrics and Gynecology

## 2017-05-31 ENCOUNTER — Other Ambulatory Visit: Payer: Self-pay | Admitting: *Deleted

## 2017-05-31 MED ORDER — NORETHINDRONE ACETATE 5 MG PO TABS: 5.0000 mg | ORAL_TABLET | Freq: Every day | ORAL | 5 refills | Status: DC

## 2017-05-31 NOTE — Progress Notes (Signed)
Called pt and informed her that refill for Aygestin has been sent to her pharmacy. Pt stated that she has been out of the medication for about a month and has not had a period. I advised this is normal. She may restart the medication. Pt then stated that she is considering trying to conceive after the first of the year and wanted to know if she should restart the pills. I advised pt to use condoms and not start the pills until she has decided for sure bout becoming pregnant since it may take a couple months for her menstrual cycle to resume. Pt voiced understanding of all information and instructions given.

## 2017-06-13 ENCOUNTER — Ambulatory Visit: Payer: 59 | Admitting: Physician Assistant

## 2017-06-13 ENCOUNTER — Other Ambulatory Visit: Payer: Self-pay

## 2017-06-13 ENCOUNTER — Encounter: Payer: Self-pay | Admitting: Physician Assistant

## 2017-06-13 VITALS — BP 132/82 | HR 94 | Temp 98.4°F | Resp 20 | Ht 65.98 in | Wt 217.4 lb

## 2017-06-13 DIAGNOSIS — L089 Local infection of the skin and subcutaneous tissue, unspecified: Secondary | ICD-10-CM

## 2017-06-13 MED ORDER — DOXYCYCLINE HYCLATE 100 MG PO CAPS: 100.0000 mg | ORAL_CAPSULE | Freq: Two times a day (BID) | ORAL | 0 refills | Status: DC

## 2017-06-13 NOTE — Progress Notes (Signed)
Katie Ward  MRN: [id number] DOB: 1985/08/17  Subjective:  Katie Ward is a 31 y.o. female seen in office today for a chief complaint of on and off infection to right pinky finger x 1 month.  Patient notes she went to get a manicure about a month ago.  A few days later she noticed pus coming out of the tip of her finger.  It got better on its own.  She then went and had another manicure.  She then noticed some purulent drainage, itching, mild discomfort about a week ago.  Has some mild numbness and tingling when she washes her hands.  Denies throbbing, redness, warmth, nail involvement, and burning.  Has not tried anything for relief.  No history of diabetes.  No history of MRSA.  Review of Systems  Constitutional: Negative for chills, diaphoresis and fever.  Gastrointestinal: Negative for nausea and vomiting.    Patient Active Problem List   Diagnosis Date Noted  . Endometriosis determined by laparoscopy 03/10/2016  . 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 Multiple Vitamins-Minerals (MULTI ADULT GUMMIES PO) Take 2 tablets by mouth daily.    . norethindrone (AYGESTIN) 5 MG tablet Take 1 tablet (5 mg total) by mouth daily. 30 tablet 5   No current facility-administered medications on file prior to visit.     Allergies  Allergen Reactions  . Other Hives and Other (See Comments)    mango      Social History   Socioeconomic History  . Marital status: Single    Spouse name: Not on file  . Number of children: 0  . Years of education: Not on file  . Highest education level: Not on file  Social Needs  . Financial resource strain: Not on file  . Food insecurity - worry: Not on file  . Food insecurity - inability: Not on file  . Transportation needs - medical: Not on file  . Transportation needs - non-medical: Not on file  Occupational History  . Not on file  Tobacco Use  . Smoking  status: Never Smoker  . Smokeless tobacco: Never Used  Substance and Sexual Activity  . Alcohol use: Yes    Comment: occ  . Drug use: No  . Sexual activity: Yes    Birth control/protection: Pill  Other Topics Concern  . Not on file  Social History Narrative  . Not on file    Objective:  BP 132/82 (BP Location: Left Arm, Patient Position: Sitting, Cuff Size: Large)   Pulse 94   Temp 98.4 F (36.9 C) (Oral)   Resp 20   Ht 5' 5.98" (1.676 m)   Wt 217 lb 6.4 oz (98.6 kg)   LMP 06/05/2017 (Approximate)   SpO2 99%   BMI 35.11 kg/m   Physical Exam  Constitutional: She is oriented to person, place, and time and well-developed, well-nourished, and in no distress.  HENT:  Head: Normocephalic and atraumatic.  Eyes: Conjunctivae are normal.  Neck: Normal range of motion.  Pulmonary/Chest: Effort normal.  Musculoskeletal:       Right hand: She exhibits normal range of motion, no bony tenderness and normal capillary refill.  Neurological: She is alert and oriented to person, place, and time. Gait normal.  Strength of 5th distal phalanx 5/5. Sensation of 5th digit intact.   Skin: Skin is warm and dry.     Psychiatric: Affect normal.  Vitals reviewed.  Wound care:  Affected area was cleansed with alcohol pad.  Mild serosanguineous and purulent drainage expressed.  Wound culture obtained.  Mupirocin ointment and bandage applied.  Assessment and Plan :  1. Infection, skin Patient has likely obtained a superficial skin infection from manicure.  Do not suspect felon as patient does not have any significant pain or throbbing and palpation of digital pulp does not elicit any pain. Recommend oral antibiotics at this time.  Encouraged patient to perform warm soaks with the affected finger.  Plan to follow-up in 10 days for reevaluation.  If no improvement at that time, consider obtaining a plain film image. - doxycycline (VIBRAMYCIN) 100 MG capsule; Take 1 capsule (100 mg total) by mouth 2  (two) times daily.  Dispense: 20 capsule; Refill: 0 - WOUND CULTURE  Pt advised to return to clinic if symptoms worsen, do not start to improve to 3 days, or as needed.  Tenna Delaine PA-C  [facility] 06/13/2017 12:14 PM

## 2017-06-13 NOTE — Patient Instructions (Addendum)
Your physical exam and history are consistent with skin infection.  I recommend taking oral antibiotic as prescribed.  Avoid getting manicures until the finger is completely healed.  He may also benefit from doing warm soaks.  I would like you to return to office in 10 days after you complete antibiotic for reevaluation.  Return sooner if symptoms worsen.  While taking Doxycycline:  -Do not drink milk or take iron supplements, multivitamins, calcium supplements, antacids, laxatives within 2 hours before or after taking doxycycline. -Avoid direct exposure to sunlight or tanning beds. Doxycycline can make you sunburn more easily. Wear protective clothing and use sunscreen (SPF 30 or higher) when you are outdoors. -Antibiotic medicines can cause diarrhea, which may be a sign of a new infection. If you have diarrhea that is watery or bloody, stop taking this medicine and seek medical care.   Cellulitis, Adult Cellulitis is a skin infection. The infected area is usually red and sore. This condition occurs most often in the arms and lower legs. It is very important to get treated for this condition. Follow these instructions at home:  Take over-the-counter and prescription medicines only as told by your doctor.  If you were prescribed an antibiotic medicine, take it as told by your doctor. Do not stop taking the antibiotic even if you start to feel better.  Drink enough fluid to keep your pee (urine) clear or pale yellow.  Do not touch or rub the infected area.  Raise (elevate) the infected area above the level of your heart while you are sitting or lying down.  Place warm or cold wet cloths (warm or cold compresses) on the infected area. Do this as told by your doctor.  Keep all follow-up visits as told by your doctor. This is important. These visits let your doctor make sure your infection is not getting worse. Contact a doctor if:  You have a fever.  Your symptoms do not get better after  1-2 days of treatment.  Your bone or joint under the infected area starts to hurt after the skin has healed.  Your infection comes back. This can happen in the same area or another area.  You have a swollen bump in the infected area.  You have new symptoms.  You feel ill and also have muscle aches and pains. Get help right away if:  Your symptoms get worse.  You feel very sleepy.  You throw up (vomit) or have watery poop (diarrhea) for a long time.  There are red streaks coming from the infected area.  Your red area gets larger.  Your red area turns darker. This information is not intended to replace advice given to you by your health care provider. Make sure you discuss any questions you have with your health care provider. Document Released: 12/14/2007 Document Revised: 12/03/2015 Document Reviewed: 05/06/2015 Elsevier Interactive Patient Education  2018 [employer].   IF you received an x-ray today, you will receive an invoice from [facility]. Please contact [facility] at [phone] with questions or concerns regarding your invoice.   IF you received labwork today, you will receive an invoice from [facility]. Please contact [facility] at [phone] with questions or concerns regarding your invoice.   Our billing staff will not be able to assist you with questions regarding bills from these companies.  You will be contacted with the lab results as soon as they are available. The fastest way to get your results is to activate your My Chart account.  Instructions are located on the last page of this paperwork. If you have not heard from [place] regarding the results in 2 weeks, please contact this office.

## 2017-06-15 LAB — WOUND CULTURE: ORGANISM ID, BACTERIA: NONE SEEN

## 2017-06-24 ENCOUNTER — Encounter: Payer: Self-pay | Admitting: Physician Assistant

## 2017-06-24 ENCOUNTER — Other Ambulatory Visit: Payer: Self-pay

## 2017-06-24 ENCOUNTER — Ambulatory Visit (INDEPENDENT_AMBULATORY_CARE_PROVIDER_SITE_OTHER): Payer: 59 | Admitting: Physician Assistant

## 2017-06-24 VITALS — BP 114/74 | HR 94 | Temp 98.2°F | Resp 16 | Ht 64.25 in | Wt 217.2 lb

## 2017-06-24 DIAGNOSIS — L089 Local infection of the skin and subcutaneous tissue, unspecified: Secondary | ICD-10-CM

## 2017-06-24 LAB — POCT SKIN KOH: Skin KOH, POC: NEGATIVE

## 2017-06-24 MED ORDER — MUPIROCIN 2 % EX OINT: 1.0000 "application " | TOPICAL_OINTMENT | Freq: Three times a day (TID) | CUTANEOUS | 0 refills | Status: AC

## 2017-06-24 NOTE — Progress Notes (Signed)
Jamillia Closson  MRN: [id number] DOB: Apr 07, 1986  Subjective:  Katie Ward is a 31 y.o. female seen in office today for a chief complaint of follow-up on skin infection of right pinky finger.  Patient initially seen on 06/13/17.  She had received manicure about a month prior to this.  A few days after her maniure she noticed pus coming out of the tip of her finger.  It had gotten better.  She then had another manicure and developed more purulent draining, itching, and mild discomfort.  Physical exam at last visit revealed right fifth digit with erythema, swelling, and weeping serosanguineous fluid.  There was minimal dried yellow secretions noted and minimal purulent drainage expressed.  Wound culture was obtained.  Patient started on doxycycline.  Today, she reports that she is doing much better.  Notes the purulent drainage resolved after 2-3 days of the antibiotics.  She has taken the antibiotics until completion. Last dose was yesterday. She denies pain, itching, numbness, tingling, redness and warmth.  Notes there is still some scaling on the tip of her finger.  She has no history of diabetes.  Denies smoking.  Review of Systems  Constitutional: Negative for chills, diaphoresis and fever.  Gastrointestinal: Negative for nausea and vomiting.    Patient Active Problem List   Diagnosis Date Noted  . Endometriosis determined by laparoscopy 03/10/2016  . 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 Multiple Vitamins-Minerals (MULTI ADULT GUMMIES PO) Take 2 tablets by mouth daily.    . norethindrone (AYGESTIN) 5 MG tablet Take 1 tablet (5 mg total) by mouth daily. 30 tablet 5  . doxycycline (VIBRAMYCIN) 100 MG capsule Take 1 capsule (100 mg total) by mouth 2 (two) times daily. (Patient not taking: Reported on 06/24/2017) 20 capsule 0   No current facility-administered medications on file prior to visit.      Allergies  Allergen Reactions  . Other Hives and Other (See Comments)    mango     Objective:  BP 114/74 (BP Location: Left Arm, Patient Position: Sitting, Cuff Size: Large)   Pulse 94   Temp 98.2 F (36.8 C) (Oral)   Resp 16   Ht 5' 4.25" (1.632 m)   Wt 217 lb 3.2 oz (98.5 kg)   LMP 06/05/2017 (Approximate)   SpO2 97%   BMI 36.99 kg/m   Physical Exam  Constitutional: She is oriented to person, place, and time and well-developed, well-nourished, and in no distress.  HENT:  Head: Normocephalic and atraumatic.  Eyes: Conjunctivae are normal.  Neck: Normal range of motion.  Pulmonary/Chest: Effort normal.  Musculoskeletal:       Right hand: She exhibits normal range of motion and normal capillary refill. Normal strength noted.  Neurological: She is alert and oriented to person, place, and time. Gait normal.  Skin: Skin is warm and dry.     Psychiatric: Affect normal.  Vitals reviewed.  Results for orders placed or performed in visit on 06/24/17 (from the past 24 hour(s))  POCT Skin KOH     Status: None   Collection Time: 06/24/17 10:30 AM  Result Value Ref Range   Skin KOH, POC Negative Negative    Assessment and Plan :  1. Infection, skin Much improved since initial visit. Pt has completed antibiotic course. She is asymptomatic at this time. There is still some hyperpigmentation and scaling of finger tip. Skin KOH negative.  No erythema,  warmth, purulent drainage, or tenderness. Recommend interchanging between topical antibiotic ointment and topical antifungal ointment for the next week. Continue warm soaks and daily cleansing of finger tip. Avoid manicures. Return to clinic if symptoms worsen, she develops new concerning symptoms, or no improvement in 7 days. - POCT Skin KOH - mupirocin ointment (BACTROBAN) 2 %; Apply 1 application topically 3 (three) times daily for 7 days.  Dispense: 22 g; Refill: 0  Tenna Delaine PA-C  [facility] 06/24/2017 10:49 AM

## 2017-06-24 NOTE — Patient Instructions (Addendum)
I am glad to see that your infection has improved.  At this point, I recommend applying daily mupirocin ointment.  You may also want to purchase some over-the-counter antifungal cream such as Lotrimin cream and apply this to the affected area as well.  If no improvement in 1 week or symptoms worsen, please return to office for further evaluation.  I recommend continuing washing hands with soap and water daily.  I also recommend performing warm soaks 4-5 times a day.    IF you received an x-ray today, you will receive an invoice from [facility]. Please contact [facility] at [phone] with questions or concerns regarding your invoice.   IF you received labwork today, you will receive an invoice from [place]. Please contact [facility] at [phone] with questions or concerns regarding your invoice.   Our billing staff will not be able to assist you with questions regarding bills from these companies.  You will be contacted with the lab results as soon as they are available. The fastest way to get your results is to activate your My Chart account. Instructions are located on the last page of this paperwork. If you have not heard from [place] regarding the results in 2 weeks, please contact this office.

## 2017-11-30 ENCOUNTER — Other Ambulatory Visit: Payer: Self-pay | Admitting: General Practice

## 2017-11-30 MED ORDER — NORETHINDRONE ACETATE 5 MG PO TABS: 5.0000 mg | ORAL_TABLET | Freq: Every day | ORAL | 5 refills | Status: DC

## 2018-06-21 ENCOUNTER — Other Ambulatory Visit: Payer: Self-pay

## 2018-06-21 DIAGNOSIS — Z01419 Encounter for gynecological examination (general) (routine) without abnormal findings: Secondary | ICD-10-CM

## 2018-06-21 MED ORDER — NORETHINDRONE ACETATE 5 MG PO TABS: 5.0000 mg | ORAL_TABLET | Freq: Every day | ORAL | 3 refills | Status: DC

## 2018-06-21 NOTE — Progress Notes (Signed)
Refilled pt norethindrone 5 mg tablets for 3 months per protocol, sent pt mychart message advising her that her Rx was sent in and that we only sent three months to give her enough time to make an appointment to see a provider for a yearly check on her contraception.

## 2018-06-24 DIAGNOSIS — M791 Myalgia, unspecified site: Secondary | ICD-10-CM | POA: Diagnosis not present

## 2018-06-24 DIAGNOSIS — J029 Acute pharyngitis, unspecified: Secondary | ICD-10-CM | POA: Diagnosis not present

## 2018-06-24 DIAGNOSIS — R5383 Other fatigue: Secondary | ICD-10-CM | POA: Diagnosis not present

## 2018-06-24 DIAGNOSIS — R51 Headache: Secondary | ICD-10-CM | POA: Diagnosis not present

## 2018-06-24 DIAGNOSIS — J1189 Influenza due to unidentified influenza virus with other manifestations: Secondary | ICD-10-CM | POA: Diagnosis not present

## 2018-06-24 DIAGNOSIS — R509 Fever, unspecified: Secondary | ICD-10-CM | POA: Diagnosis not present

## 2018-06-25 ENCOUNTER — Ambulatory Visit: Payer: Self-pay

## 2018-06-25 NOTE — Telephone Encounter (Signed)
  Returned patient call who stated that she had been seen over the weekend and given tamiflu. She states they told her she had the flu. She had a fever of 102 and scratch throat.  Today temp is 99. She states she had nausea but had not vomited. After taking tamiflu yesterday pt states she vomited several times.  She has not vomited today but has not taken the medication today.Teaching about tamiflu was reviewed  by NT.  Pt was concerned that she could not go to work if vomiting.  Pt was advised to not return to work until 48 hours after fever resolved without the need of fever reducing medication.  Pt will try taking Tamiflu with some food. Pt was advised to stop the tamiflu if it causes nausea. Care advice read to patient. Pt verbalized understanding of all instruction. Reason for Disposition . Unexplained nausea  Answer Assessment - Initial Assessment Questions 1. NAUSEA SEVERITY: "How bad is the nausea?" (e.g., mild, moderate, severe; dehydration, weight loss)   - MILD: loss of appetite without change in eating habits   - MODERATE: decreased oral intake without significant weight loss, dehydration, or malnutrition   - SEVERE: inadequate caloric or fluid intake, significant weight loss, symptoms of dehydration     moderate 2. ONSET: "When did the nausea begin?"     After taking tamiflu on empty stomach 3. VOMITING: "Any vomiting?" If so, ask: "How many times today?"     No vomiting today 4. RECURRENT SYMPTOM: "Have you had nausea before?" If so, ask: "When was the last time?" "What happened that time?"     No but with medication 5. CAUSE: "What do you think is causing the nausea?"     Flu medication 6. PREGNANCY: "Is there any chance you are pregnant?" (e.g., unprotected intercourse, missed birth control pill, broken condom)     N/A  Protocols used: NAUSEA-A-AH

## 2018-09-26 ENCOUNTER — Other Ambulatory Visit: Payer: Self-pay

## 2018-09-26 DIAGNOSIS — Z01419 Encounter for gynecological examination (general) (routine) without abnormal findings: Secondary | ICD-10-CM

## 2018-09-26 MED ORDER — NORETHINDRONE ACETATE 5 MG PO TABS: 5.0000 mg | ORAL_TABLET | Freq: Every day | ORAL | 1 refills | Status: DC

## 2018-09-26 NOTE — Telephone Encounter (Signed)
Patient is requesting a refill on birth control pills.

## 2018-10-17 ENCOUNTER — Other Ambulatory Visit: Payer: Self-pay

## 2018-10-17 NOTE — Telephone Encounter (Signed)
Opened in error

## 2018-10-30 ENCOUNTER — Other Ambulatory Visit: Payer: Self-pay

## 2018-10-30 DIAGNOSIS — Z01419 Encounter for gynecological examination (general) (routine) without abnormal findings: Secondary | ICD-10-CM

## 2018-10-30 MED ORDER — NORETHINDRONE ACETATE 5 MG PO TABS: 5.0000 mg | ORAL_TABLET | Freq: Every day | ORAL | 1 refills | Status: DC

## 2018-10-30 NOTE — Telephone Encounter (Signed)
Received fax from [place] on [address] for 90 day supply for Norethindrone 5 MG Tablet.

## 2019-01-28 ENCOUNTER — Telehealth: Payer: Self-pay | Admitting: Family Medicine

## 2019-01-28 NOTE — Telephone Encounter (Signed)
Spoke to patient about her appointment on 7/21 @ 8:30. Patient instructed to wear a face mask for the entire appointment and no visitors are allowed with her during the visit. Patient screened for covid symptoms and denied having any

## 2019-01-29 ENCOUNTER — Encounter: Payer: Self-pay | Admitting: Family Medicine

## 2019-01-29 ENCOUNTER — Ambulatory Visit (INDEPENDENT_AMBULATORY_CARE_PROVIDER_SITE_OTHER): Payer: BC Managed Care – PPO

## 2019-01-29 ENCOUNTER — Other Ambulatory Visit: Payer: Self-pay

## 2019-01-29 DIAGNOSIS — Z3201 Encounter for pregnancy test, result positive: Secondary | ICD-10-CM | POA: Diagnosis not present

## 2019-01-29 LAB — POCT PREGNANCY, URINE: Preg Test, Ur: POSITIVE — AB

## 2019-01-29 NOTE — Progress Notes (Signed)
Pt here today for pregnancy test.  Resulted positive.  Pt denies any pain or bleeding.  Medications reconciled.  Pt reports LMP 12/23/18 with EDD 09/29/19 5w 2d today.  [place] office to provide proof of pregnancy letter to start prenatal care.    Mel Almond, RN 01/29/19

## 2019-01-29 NOTE — Progress Notes (Signed)
Patient seen and assessed by nursing staff during this encounter. I have reviewed the chart and agree with the documentation and plan.  Kerry Hough, PA-C 01/29/2019 9:35 AM

## 2019-02-10 ENCOUNTER — Ambulatory Visit (HOSPITAL_COMMUNITY)
Admission: EM | Admit: 2019-02-10 | Discharge: 2019-02-10 | Disposition: A | Discharge status: Home / Self Care | Payer: BC Managed Care – PPO | Attending: Family Medicine | Admitting: Family Medicine

## 2019-02-10 ENCOUNTER — Ambulatory Visit
Admission: RE | Admit: 2019-02-10 | Discharge: 2019-02-10 | Disposition: A | Discharge status: Home / Self Care | Payer: BC Managed Care – PPO | Source: Ambulatory Visit

## 2019-02-10 ENCOUNTER — Encounter (HOSPITAL_COMMUNITY): Payer: Self-pay | Admitting: *Deleted

## 2019-02-10 ENCOUNTER — Other Ambulatory Visit: Payer: Self-pay

## 2019-02-10 DIAGNOSIS — N898 Other specified noninflammatory disorders of vagina: Secondary | ICD-10-CM | POA: Insufficient documentation

## 2019-02-10 DIAGNOSIS — Z3A01 Less than 8 weeks gestation of pregnancy: Secondary | ICD-10-CM

## 2019-02-10 NOTE — ED Provider Notes (Signed)
Virtual Visit via Video Note:  [name]  initiated request for Telemedicine visit with [facility] Urgent Care team. I connected with [name]  on 02/10/2019 at 1:33 PM  for a synchronized telemedicine visit using a video enabled HIPPA compliant telemedicine application. I verified that I am speaking with [name]  using two identifiers. [name], PA-C  was physically located in a [facility] Urgent care site and [name] was located at a different location.   The limitations of evaluation and management by telemedicine as well as the availability of in-person appointments were discussed. Patient was informed that she  may incur a bill ( including co-pay) for this virtual visit encounter. [name]  expressed understanding and gave verbal consent to proceed with virtual visit.     History of Present Illness:Katie Ward  is a 33 y.o. female presents with 1 day history of brown vaginal discharge and worsened today. Discharge was darker, thicker. Patient is [redacted] weeks pregnant.   Denies pelvic pain, abdominal pain, bloody vaginal discharge.  She just wants to make sure that she is okay.  Wants to know if she is having a miscarriage.   Review of Systems  Constitutional: Negative for chills, fever and malaise/fatigue.  Respiratory: Negative for cough and shortness of breath.   Cardiovascular: Negative for chest pain.  Gastrointestinal: Positive for nausea (morning sickness, not new). Negative for abdominal pain, diarrhea and vomiting.  Genitourinary: Negative for dysuria, flank pain, frequency, hematuria and urgency.  Musculoskeletal: Negative for back pain and myalgias.  Skin: Negative for rash.  Neurological: Negative for dizziness and headaches.  Psychiatric/Behavioral: Negative for substance abuse.    No current facility-administered medications for this encounter.    Current Outpatient Medications  Medication Sig Dispense Refill  . Prenatal Vit-Fe Fumarate-FA  (PREPLUS) 27-1 MG TABS Take 1 tablet by mouth daily.       Allergies  Allergen Reactions  . Other Hives and Other (See Comments)    mango     Past Medical History:  Diagnosis Date  . Anemia   . 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 Fibroid   . Headache   . Heart murmur    slight    Past Surgical History:  Procedure Laterality Date  . LAPAROSCOPY N/A 02/09/2016   Procedure: LAPAROSCOPY DIAGNOSTIC;  Surgeon: Aletha Halim, MD;  Location: [facility];  Service: Gynecology;  Laterality: N/A;      Observations/Objective: Physical Exam   Assessment and Plan:  1. Vaginal discharge   2. Vaginal discharge during pregnancy in first trimester     Counseled patient that the most appropriate course of action would be for her to come on into the clinic and get a swab so that we could test for STI, BV and yeast vaginitis.  Her vaginal discharge seems mild in nature but counseled that she would need an ultrasound to rule out any complication which we do not have.  I do not believe that she is in need of this currently as she has no pain, no fever and no bleeding.  Patient is agreeable to present to the urgent care clinic today for an in person visit.   Follow Up Instructions:    I discussed the assessment and treatment plan with the patient. The patient was provided an opportunity to ask questions and all were answered. The patient agreed with the plan and demonstrated an understanding of the instructions.   The patient was advised to call  back or seek an in-person evaluation if the symptoms worsen or if the condition fails to improve as anticipated.  I provided 10 minutes of non-face-to-face time during this encounter.    [name], PA-C  02/10/2019 1:33 PM         [name], PA-C 02/10/19 1525

## 2019-02-10 NOTE — ED Provider Notes (Signed)
  MRN: [id number] DOB: [birth year]  Subjective:   Katie Ward is a 33 y.o. female presenting for testing following her tele-visit today.  Refer to the note for more detail.  She is established with an OB, had her pregnancy confirmed with the [facility].  Has an appointment in August with her obstetrician.  No current facility-administered medications for this encounter.   Current Outpatient Medications:  .  Prenatal Vit-Fe Fumarate-FA (PREPLUS) 27-1 MG TABS, Take 1 tablet by mouth daily., Disp: , Rfl:    Allergies  Allergen Reactions  . Other Hives and Other (See Comments)    mango    Past Medical History:  Diagnosis Date  . Anemia   . 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 Fibroid   . Headache   . Heart murmur    slight     Past Surgical History:  Procedure Laterality Date  . LAPAROSCOPY N/A 02/09/2016   Procedure: LAPAROSCOPY DIAGNOSTIC;  Surgeon: Aletha Halim, MD;  Location: [facility];  Service: Gynecology;  Laterality: N/A;    ROS Denies fever, vomiting, abdominal pain, pelvic pain, vaginal bleeding, dysuria, hematuria, urinary frequency.  Objective:   Vitals: BP 135/82   Pulse 90   Temp 99.2 F (37.3 C) (Oral)   Resp 16   LMP 12/23/2018 (Exact Date)   SpO2 98%   Physical Exam Constitutional:      General: She is not in acute distress.    Appearance: Normal appearance. She is well-developed. She is not ill-appearing.  HENT:     Head: Normocephalic and atraumatic.     Nose: Nose normal.     Mouth/Throat:     Mouth: Mucous membranes are moist.     Pharynx: Oropharynx is clear.  Eyes:     General: No scleral icterus.    Extraocular Movements: Extraocular movements intact.     Pupils: Pupils are equal, round, and reactive to light.  Cardiovascular:     Rate and Rhythm: Normal rate.  Pulmonary:     Effort: Pulmonary effort is normal.  Skin:    General: Skin is warm and dry.  Neurological:     General: No focal deficit present.    Mental Status: She is alert and oriented to person, place, and time.  Psychiatric:        Mood and Affect: Mood normal.        Behavior: Behavior normal.        Thought Content: Thought content normal.        Judgment: Judgment normal.     Assessment and Plan :   1. Vaginal discharge   2. [redacted] weeks gestation of pregnancy     Labs pending, will treat as appropriate.  ER precautions reviewed.   [name], [place] 02/10/19 1533

## 2019-02-10 NOTE — ED Triage Notes (Addendum)
Pt states [redacted] wks pregnant; has not had confirmatory [place] yet.  Has appt set with OB 8/12.  Reports brownish vaginal discharge since yesterday without abd pain, fevers, or any other c/o's.  Denies urinary sxs.

## 2019-02-10 NOTE — Discharge Instructions (Addendum)
If you develop fever, pelvic or abdominal pain, have significant vaginal bleeding then please report to the [facility] (Maternal Admission Unit).

## 2019-02-13 LAB — CERVICOVAGINAL ANCILLARY ONLY
Bacterial vaginitis: NEGATIVE
Candida vaginitis: NEGATIVE
Chlamydia: NEGATIVE
Neisseria Gonorrhea: NEGATIVE
Trichomonas: NEGATIVE

## 2019-02-19 ENCOUNTER — Telehealth: Payer: Self-pay | Admitting: Advanced Practice Midwife

## 2019-02-19 ENCOUNTER — Telehealth: Payer: Self-pay | Admitting: Family Medicine

## 2019-02-19 NOTE — Telephone Encounter (Signed)
Spoke with patient about appointment on 8/12 @ 9:30. Patient instructed that this visit it a telephone visit. Patient verbalized understanding.

## 2019-02-19 NOTE — Telephone Encounter (Signed)
Called the patient to inform of the upcoming appointment. Informed of how to log in to the mychart app and also advised please log in at least 15 minutes prior to the appointment time. If the provider does not log in 15 minutes after the appointment time, please call our office. The patient verbalized understanding.

## 2019-02-20 ENCOUNTER — Other Ambulatory Visit: Payer: Self-pay

## 2019-02-20 ENCOUNTER — Ambulatory Visit (INDEPENDENT_AMBULATORY_CARE_PROVIDER_SITE_OTHER): Payer: BC Managed Care – PPO | Admitting: *Deleted

## 2019-02-20 DIAGNOSIS — N809 Endometriosis, unspecified: Secondary | ICD-10-CM

## 2019-02-20 DIAGNOSIS — O99210 Obesity complicating pregnancy, unspecified trimester: Secondary | ICD-10-CM | POA: Insufficient documentation

## 2019-02-20 DIAGNOSIS — O0990 Supervision of high risk pregnancy, unspecified, unspecified trimester: Secondary | ICD-10-CM

## 2019-02-20 DIAGNOSIS — D219 Benign neoplasm of connective and other soft tissue, unspecified: Secondary | ICD-10-CM | POA: Insufficient documentation

## 2019-02-20 NOTE — Progress Notes (Signed)
Patient seen and assessed by nursing staff during this encounter. I have reviewed the chart and agree with the documentation and plan.  Kerry Hough, PA-C 02/20/2019 10:25 AM

## 2019-02-20 NOTE — Progress Notes (Signed)
I connected with  [name] on 02/20/19 at  9:30 AM EDT by telephone and verified that I am speaking with the correct person using two identifiers.   I discussed the limitations, risks, security and privacy concerns of performing an evaluation and management service by telephone and the availability of in person appointments. I also discussed with the patient that there may be a patient responsible charge related to this service. The patient expressed understanding and agreed to proceed.  We discussed her EDD is based on sure LMP.  She has MyChart app.  Explained we will send her [employer] app- app sent to her while on phone.  Explained we would like her to take her blood pressure weekly and enter into [employer] App. Due to her having private insurance and blood pressure cuff is not covered I asked if she has access to a blood pressure cuff or can afford to purchase one. She states she can purchase one. Explained we will have her take her blood pressure weekly and enter into the app. Explained she will have some visits in office and some virtually. Reviewed appointment date/ time with her , our location and to wear mask, no visitors. Explained she will have exam, ob bloodwork, hemoglobin a1C, cbg fasting if possible , genetic testing if desired, pap if needed. Explained we will schedule an [place] at 19 weeks and she will get notification via [place]. She voices understanding.   Jakya Dovidio,RN  02/20/2019  9:28 AM

## 2019-03-11 ENCOUNTER — Telehealth: Payer: Self-pay | Admitting: Family Medicine

## 2019-03-11 NOTE — Telephone Encounter (Signed)
Called the patient to confirm the upcoming appointment. Left a detailed voicemail informing the patient if shes been diagnosed with covid or in close contact with someone whos had covid please call to reschedule the appointment. Also advised if she has experienced any flu like symptoms such as sore throat, shortness of breath, fever, or rash please also call to rescheduled the appointment.

## 2019-03-12 ENCOUNTER — Encounter: Payer: Self-pay | Admitting: Medical

## 2019-03-12 ENCOUNTER — Ambulatory Visit (HOSPITAL_COMMUNITY)
Admission: RE | Admit: 2019-03-12 | Discharge: 2019-03-12 | Disposition: A | Discharge status: Home / Self Care | Payer: BC Managed Care – PPO | Source: Ambulatory Visit | Attending: Medical | Admitting: Medical

## 2019-03-12 ENCOUNTER — Other Ambulatory Visit: Payer: Self-pay

## 2019-03-12 ENCOUNTER — Ambulatory Visit (INDEPENDENT_AMBULATORY_CARE_PROVIDER_SITE_OTHER): Payer: BC Managed Care – PPO | Admitting: Medical

## 2019-03-12 VITALS — BP 158/88 | HR 126 | Temp 98.7°F | Wt 218.4 lb

## 2019-03-12 DIAGNOSIS — O3680X0 Pregnancy with inconclusive fetal viability, not applicable or unspecified: Secondary | ICD-10-CM | POA: Diagnosis not present

## 2019-03-12 DIAGNOSIS — Z3A10 10 weeks gestation of pregnancy: Secondary | ICD-10-CM | POA: Diagnosis not present

## 2019-03-12 DIAGNOSIS — O020 Blighted ovum and nonhydatidiform mole: Secondary | ICD-10-CM | POA: Diagnosis not present

## 2019-03-12 MED ORDER — MISOPROSTOL 200 MCG PO TABS: ORAL_TABLET | ORAL | 1 refills | Status: DC

## 2019-03-12 MED ORDER — IBUPROFEN 600 MG PO TABS: 600.0000 mg | ORAL_TABLET | Freq: Four times a day (QID) | ORAL | 1 refills | Status: DC | PRN

## 2019-03-12 MED ORDER — OXYCODONE-ACETAMINOPHEN 5-325 MG PO TABS: 1.0000 | ORAL_TABLET | Freq: Four times a day (QID) | ORAL | 0 refills | Status: DC | PRN

## 2019-03-12 MED ORDER — PROMETHAZINE HCL 12.5 MG PO TABS: 12.5000 mg | ORAL_TABLET | Freq: Four times a day (QID) | ORAL | 0 refills | Status: DC | PRN

## 2019-03-12 NOTE — Progress Notes (Signed)
History:  Ms. Katie Ward is a 33 y.o. G3P0020 who presents to clinic today for her first OB appointment. CMA was unable to obtain FHTs with doppler. Patient should be 11 weeks by LMP and has had no prior US. Bedside US performed and unable to visualize IUP. Patient has multiple large fibroids. Transabdominal US performed at [place] unable to confirm IUP. Formal US ordered and performed by Vance Peper shows IUGS only with MSD consistent with [redacted] weeks gestation, no YS or FP noted. Many large fibroids and 2 ovarian cysts also noted. Patient denies abdominal pain or bleeding today.    The following portions of the patient's history were reviewed and updated as appropriate: allergies, current medications, family history, past medical history, social history, past surgical history and problem list.  Review of Systems:  Review of Systems  Constitutional: Negative for fever.  Gastrointestinal: Negative for abdominal pain.  Genitourinary:       Neg- vaginal bleeding      Objective:  Physical Exam BP (!) 158/88   Pulse (!) 126   Temp 98.7 F (37.1 C)   Wt 218 lb 6.4 oz (99.1 kg)   LMP 12/23/2018 (Exact Date)   BMI 37.20 kg/m  Physical Exam  Vitals reviewed. Constitutional: She is oriented to person, place, and time. She appears well-developed and well-nourished. No distress.  HENT:  Head: Normocephalic.  Cardiovascular: Tachycardia present.  Respiratory: Effort normal.  GI: She exhibits no distension. There is no abdominal tenderness.  Lower abdomen is firm to touch  Neurological: She is alert and oriented to person, place, and time.  Skin: Skin is warm and dry. No erythema.  Psychiatric: She has a normal mood and affect.    Labs and Imaging  US Ob Less Than 14 Weeks With Ob Transvaginal  Result Date: 03/12/2019 CLINICAL DATA:  Pregnancy of uncertain fetal viability, history of fibroids, surgery for endometriosis EXAM: OBSTETRIC <14 WK [place] AND TRANSVAGINAL OB US TECHNIQUE: Both  transabdominal and transvaginal ultrasound examinations were performed for complete evaluation of the gestation as well as the maternal uterus, adnexal regions, and pelvic cul-de-sac. Transvaginal technique was performed to assess early pregnancy. COMPARISON:  None for this gestation FINDINGS: Intrauterine gestational sac: Present, single, abnormal deformed and elongated Yolk sac:  Not identified Embryo:  Not identified Cardiac Activity: N/A Heart Rate: N/A  bpm MSD: 45.3 mm   10 w   0 d Subchorionic hemorrhage:  None visualized. Maternal uterus/adnexae: Enlarged uterus containing multiple masses consistent with uterine leiomyomata. Largest lesions are: Exophytic from anterior wall of mid uterus 6.5 x 5.3 x 8.0 cm Exophytic from RIGHT lateral lower uterus 6.8 x 5.3 x 4.6 cm Subserosal posterior mid uterus 3.7 x 3.0 x 4.0 cm Subserosal anterior wall mid upper uterus 4.1 x 3.1 x 3.8 cm. RIGHT ovary contains a complex slightly irregular cyst 4.6 x 3.4 x 4.0 cm with homogeneous low level internal echogenicity throughout the lesion most consistent with an endometrioma. LEFT ovary contains a complex hypoechoic nodule 2.5 x 5.6 x 3.9 cm with a thick rim, several thick irregular septations and scattered internal hyperechogenicity, of uncertain etiology. This lesion demonstrates no hypervascularity on color Doppler imaging. Small amount of free fluid in pelvis adjacent to LEFT ovary. No other pelvic masses. IMPRESSION: Large empty gestational sac within the uterus with mean sac diameter of 45 mm. Findings meet definitive criteria for failed pregnancy. This follows SRU consensus guidelines: Diagnostic Criteria for Nonviable Pregnancy Early in the First Trimester. N Engl J Med  [id number]. Endometrioma of the RIGHT ovary/adnexa. Complex cystic lesion of the LEFT adnexa/ovary measuring 2.5 x 5.6 x 3.9 cm, of uncertain etiology; this could represent a nonspecific cystic ovarian mass, old hemorrhagic cyst, atypical  endometrioma, coexistent ectopic pregnancy not excluded with this appearance. Recommend serial quantitative beta HCG and follow-up imaging to definitively exclude a coexistent heterotopic pregnancy. These results will be called to the ordering clinician or representative by the Radiologist Assistant, and communication documented in the PACS or zVision Dashboard. Electronically Signed   By: Lavonia Dana M.D.   On: 03/12/2019 17:12   MDM Discussed [place] with Dr. Nehemiah Settle. He agrees with plan to offer Cytotec vs D&E.  Discussed results and options with the patient. She would like to try Cytotec first in an attempt to avoid surgery.  Discussed that Cytotec should work within 24 hours and that I will provide a refill if she has not had significant bleeding after 24 hours she should repeat the dose. If neither dose produced adequate bleeding she should call the office next week for an appointment to discuss D&E with a surgeon. The patient states that she plans to take the first dose of Cytotec on Thursday evening since she is off on Friday.   Early Intrauterine Pregnancy Failure  __x_  Documented intrauterine pregnancy failure less than or equal to [redacted] weeks gestation  _x__  No serious current illness  ___  Baseline Hgb greater than or equal to 10g/dl  _x__  Patient has easily accessible transportation to the hospital  _x__  Clear preference  _x__  Practitioner/physician deems patient reliable  _x__  Counseling by practitioner or physician  _N/A__  Patient education by RN  _N/A__  Consent form signed  _N/A__  Rho-Gam given by RN if indicated  __x_ Medication dispensed   __x_   Cytotec 800 mcg  _x_   Intravaginally by patient at home         __   Intravaginally by RN in MAU        __   Rectally by patient at home        __   Rectally by RN in MAU  _x__  Ibuprofen 600 mg 1 tablet by mouth every 6 hours as needed #30  _x__  Oxycodone/acetaminophen 5/325 mg by mouth every 4 to 6 hours as  needed  _x__  Phenergan 12.5 mg by mouth every 4 hours as needed for nausea     Assessment & Plan:  1. Blighted ovum - Bleeding precautions discussed  - misoprostol (CYTOTEC) 200 MCG tablet; Place 4 tabs (800 mcg) vaginally once at bedtime, repeat in 24 hours if no bleeding  Dispense: 4 tablet; Refill: 1 - ibuprofen (ADVIL) 600 MG tablet; Take 1 tablet (600 mg total) by mouth every 6 (six) hours as needed.  Dispense: 30 tablet; Refill: 1 - promethazine (PHENERGAN) 12.5 MG tablet; Take 1 tablet (12.5 mg total) by mouth every 6 (six) hours as needed for nausea or vomiting.  Dispense: 30 tablet; Refill: 0 - oxyCODONE-acetaminophen (PERCOCET/ROXICET) 5-325 MG tablet; Take 1 tablet by mouth every 6 (six) hours as needed for severe pain.  Dispense: 12 tablet; Refill: 0 - Plan for medication regimen discussed as noted above - Warning signs discussed for condition that would warrant emergent MAU follow-up - Patient to return in 1 week for hCG level only and 2 weeks with a provider for follow-up  Luvenia Redden, PA-C 03/13/2019 1:46 PM

## 2019-03-12 NOTE — Patient Instructions (Addendum)
Miscarriage °A miscarriage is the loss of an unborn baby (fetus) before the 20th week of pregnancy. °Follow these instructions at home: °Medicines ° °· Take over-the-counter and prescription medicines only as told by your doctor. °· If you were prescribed antibiotic medicine, take it as told by your doctor. Do not stop taking the antibiotic even if you start to feel better. °· Do not take NSAIDs unless your doctor says that this is safe for you. NSAIDs include aspirin and ibuprofen. These medicines can cause bleeding. °Activity °· Rest as directed. Ask your doctor what activities are safe for you. °· Have someone help you at home during this time. °General instructions °· Write down how many pads you use each day and how soaked they are. °· Watch the amount of tissue or clumps of blood (blood clots) that you pass from your vagina. Save any large amounts of tissue for your doctor. °· Do not use tampons, douche, or have sex until your doctor approves. °· To help you and your partner with the process of grieving, talk with your doctor or seek counseling. °· When you are ready, meet with your doctor to talk about steps you should take for your health. Also, talk with your doctor about steps to take to have a healthy pregnancy in the future. °· Keep all follow-up visits as told by your doctor. This is important. °Contact a doctor if: °· You have a fever or chills. °· You have vaginal discharge that smells bad. °· You have more bleeding. °Get help right away if: °· You have very bad cramps or pain in your back or belly. °· You pass clumps of blood that are walnut-sized or larger from your vagina. °· You pass tissue that is walnut-sized or larger from your vagina. °· You soak more than 1 regular pad in an hour. °· You get light-headed or weak. °· You faint (pass out). °· You have feelings of sadness that do not go away, or you have thoughts of hurting yourself. °Summary °· A miscarriage is the loss of an unborn baby before  the 20th week of pregnancy. °· Follow your doctor's instructions for home care. Keep all follow-up appointments. °· To help you and your partner with the process of grieving, talk with your doctor or seek counseling. °This information is not intended to replace advice given to you by your health care provider. Make sure you discuss any questions you have with your health care provider. °Document Released: 09/19/2011 Document Revised: 10/19/2018 Document Reviewed: 08/02/2016 °Elsevier Patient Education © 2020 Elsevier Inc. ° °

## 2019-03-13 ENCOUNTER — Telehealth: Payer: Self-pay | Admitting: *Deleted

## 2019-03-13 NOTE — Telephone Encounter (Signed)
Received a call from [facility]- wanting to be sure Katie Ward had received report of [place] yesterday showing empty gestational sac.  Per chart Almyra Free aware of results. Plan noted Jaiyla Granados,RN

## 2019-03-14 ENCOUNTER — Other Ambulatory Visit: Payer: Self-pay | Admitting: *Deleted

## 2019-03-14 DIAGNOSIS — O020 Blighted ovum and nonhydatidiform mole: Secondary | ICD-10-CM

## 2019-03-15 ENCOUNTER — Telehealth: Payer: Self-pay | Admitting: *Deleted

## 2019-03-15 DIAGNOSIS — O020 Blighted ovum and nonhydatidiform mole: Secondary | ICD-10-CM

## 2019-03-15 MED ORDER — OXYCODONE-ACETAMINOPHEN 5-325 MG PO TABS: 1.0000 | ORAL_TABLET | Freq: Four times a day (QID) | ORAL | 0 refills | Status: DC | PRN

## 2019-03-15 NOTE — Telephone Encounter (Signed)
Dhruvi called and left a message this am that she was prescribed medicines on Tuesday and she got it filled Tuesday but didn't check her bag until last night and her oxycodone wasn't in the bag. She states she went to the pharmacy and they said they have a record of the oxycodone being filled and cannot refill it without a new order.    I called Linett and she confirms she took the cytotec last night and started cramping and having a lot of pain and went to take the oxycodone but it wasn't there.  She states she took the ibuprofen and now the pain is controlled with the ibuprofen ; states was just worse around 1am . She states she is bleeding like a period and passing some small clots.  I explained she should continue taking the ibuprofen every 6 hours as needed. I explained we do not usually refill oxycodone/ narcotics but I will send a message to Almyra Free and if approved ; will contact her. I also explained usually ibuprofen is sufficiient and the that the pain should lessen as she passes the tissue, etc. I also reviewed if her pain was severe or heavy bleeding to go back to mau. She voices understanding.  Vaughan Basta, RN

## 2019-03-15 NOTE — Addendum Note (Signed)
Addended by: Luvenia Redden on: 03/15/2019 12:08 PM   Modules accepted: Orders

## 2019-03-15 NOTE — Telephone Encounter (Signed)
I sent another Rx for a small amount to her pharmacy. Please let her know to pick it up.   Thanks

## 2019-03-15 NOTE — Telephone Encounter (Signed)
I called Katie Ward and notified her RX was approved for 5 tabs and sent to her pharmacy. She voices appreciation and understanding. Linda,RN

## 2019-03-19 ENCOUNTER — Other Ambulatory Visit: Payer: BC Managed Care – PPO

## 2019-03-19 ENCOUNTER — Other Ambulatory Visit: Payer: Self-pay

## 2019-03-19 DIAGNOSIS — O020 Blighted ovum and nonhydatidiform mole: Secondary | ICD-10-CM

## 2019-03-20 LAB — BETA HCG QUANT (REF LAB): hCG Quant: 310 m[IU]/mL

## 2019-03-21 ENCOUNTER — Other Ambulatory Visit: Payer: BC Managed Care – PPO

## 2019-03-28 ENCOUNTER — Encounter: Payer: BC Managed Care – PPO | Admitting: Obstetrics & Gynecology

## 2019-04-02 ENCOUNTER — Other Ambulatory Visit: Payer: Self-pay

## 2019-04-02 ENCOUNTER — Ambulatory Visit (INDEPENDENT_AMBULATORY_CARE_PROVIDER_SITE_OTHER): Payer: BC Managed Care – PPO | Admitting: Obstetrics and Gynecology

## 2019-04-02 VITALS — BP 161/91 | HR 114 | Temp 98.4°F | Wt 216.9 lb

## 2019-04-02 DIAGNOSIS — Z5189 Encounter for other specified aftercare: Secondary | ICD-10-CM | POA: Diagnosis not present

## 2019-04-02 DIAGNOSIS — O039 Complete or unspecified spontaneous abortion without complication: Secondary | ICD-10-CM

## 2019-04-02 NOTE — Progress Notes (Signed)
Obstetrics and Gynecology Visit Return Patient Evaluation  Appointment Date: 04/02/2019  Primary Care Provider: Patient, No Pcp Per  OBGYN Clinic: [facility]  Chief Complaint: follow up medical management of blighted ovum  History of Present Illness:  Katie Ward is a 33 y.o. pt took cytotec on 9/3-4 and had bleeding shortly thereafter. No current bleeding and no period yet.   Review of Systems:  as noted in the History of Present Illness.  Medications:  Katie Ward had no medications administered during this visit. Current Outpatient Medications  Medication Sig Dispense Refill  . ibuprofen (ADVIL) 600 MG tablet Take 1 tablet (600 mg total) by mouth every 6 (six) hours as needed. (Patient not taking: Reported on 04/02/2019) 30 tablet 1  . Prenatal Vit-Fe Fumarate-FA (PREPLUS) 27-1 MG TABS Take 1 tablet by mouth daily.     No current facility-administered medications for this visit.     Allergies: is allergic to other.  Physical Exam:  BP (!) 161/91   Pulse (!) 114   Temp 98.4 F (36.9 C)   Wt 216 lb 14.4 oz (98.4 kg)   LMP 12/23/2018 (Exact Date)   BMI 36.94 kg/m  Body mass index is 36.94 kg/m. General appearance: Well nourished, well developed female in no acute distress.  Neuro/Psych:  Normal mood and affect.     Assessment: pt doing well  Plan:  1. Follow-up visit after miscarriage She technically never had a confirmed IUP with large GS but no FP or YS. Will get follow up beta hcg. Pt to establish care with PCP. Pt may want to try again soon. I told her I'd recommend waiting until next period and continuing PNV or folic acid while considering conceiving. I also told her that based on the locations of the fibroids tha I don't believe they contributed to her blighted ovum and no need for management  Endometrioma stable - Beta hCG quant (ref lab)   RTC: PRN  Durene Romans MD Attending [facility])

## 2019-04-03 ENCOUNTER — Encounter: Payer: Self-pay | Admitting: Obstetrics and Gynecology

## 2019-04-03 DIAGNOSIS — O039 Complete or unspecified spontaneous abortion without complication: Secondary | ICD-10-CM | POA: Insufficient documentation

## 2019-04-03 LAB — BETA HCG QUANT (REF LAB): hCG Quant: 25 m[IU]/mL

## 2019-04-03 NOTE — Addendum Note (Signed)
Addended by: Aletha Halim on: 04/03/2019 12:23 PM   Modules accepted: Orders

## 2019-04-09 ENCOUNTER — Other Ambulatory Visit: Payer: Self-pay

## 2019-04-09 ENCOUNTER — Other Ambulatory Visit: Payer: BC Managed Care – PPO

## 2019-04-09 ENCOUNTER — Telehealth: Payer: BC Managed Care – PPO | Admitting: Student

## 2019-04-09 DIAGNOSIS — O039 Complete or unspecified spontaneous abortion without complication: Secondary | ICD-10-CM | POA: Diagnosis not present

## 2019-04-09 DIAGNOSIS — Z5189 Encounter for other specified aftercare: Secondary | ICD-10-CM | POA: Diagnosis not present

## 2019-04-10 LAB — BETA HCG QUANT (REF LAB): hCG Quant: 10 m[IU]/mL

## 2019-04-10 NOTE — Addendum Note (Signed)
Addended by: Aletha Halim on: 04/10/2019 12:04 PM   Modules accepted: Orders

## 2019-04-15 ENCOUNTER — Ambulatory Visit: Payer: BC Managed Care – PPO | Admitting: Family Medicine

## 2019-04-15 ENCOUNTER — Other Ambulatory Visit: Payer: Self-pay

## 2019-04-15 ENCOUNTER — Encounter: Payer: Self-pay | Admitting: Family Medicine

## 2019-04-15 VITALS — BP 145/91 | HR 120 | Temp 98.4°F | Ht 64.25 in | Wt 217.0 lb

## 2019-04-15 DIAGNOSIS — R030 Elevated blood-pressure reading, without diagnosis of hypertension: Secondary | ICD-10-CM | POA: Diagnosis not present

## 2019-04-15 NOTE — Progress Notes (Signed)
10/5/20208:14 AM  Katie Ward 05/22/1986, 33 y.o., female [id number]  Chief Complaint  Patient presents with  . Hypertension    after miscarriage notices her bp has been high, has fx of htn    HPI:   Patient is a 33 y.o. female with past medical history significant for SAB in sept 2020 who presents today for elevated BP  Patient has been checking her BP at home since she found out she was pregnant  She has noticed elevated readings since late august when she found out about blighted ovum  She has no prior h/o HTN Patient does report being anxious today Review of vitals flowsheet  She denies headaches, vision changes, chest pain, SOB, edema, focal weakness, speech changes, orthopnea, PND, palpitations  She has strong fhx of HTN  No weight changes No dietary changes Only take iron supplements  She is not planning on trying to conceive again Not on BC, not planning on starting  BP Readings from Last 3 Encounters:  04/15/19 (!) 145/91  04/02/19 (!) 161/91  03/12/19 (!) 158/88    Depression screen PHQ 2/9 04/15/2019 04/02/2019 02/20/2019  Decreased Interest 0 0 0  Down, Depressed, Hopeless 0 0 0  PHQ - 2 Score 0 0 0  Altered sleeping - 0 0  Tired, decreased energy - 0 3  Change in appetite - 0 0  Feeling bad or failure about yourself  - 0 0  Trouble concentrating - 0 0  Moving slowly or fidgety/restless - 0 0  Suicidal thoughts - 0 0  PHQ-9 Score - 0 3    Fall Risk  04/15/2019 06/24/2017 06/13/2017  Falls in the past year? 0 No No  Number falls in past yr: 0 - -     Allergies  Allergen Reactions  . Other Hives and Other (See Comments)    mango    Prior to Admission medications   Not on File    Past Medical History:  Diagnosis Date  . Anemia   . 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 Fibroid   . Headache   . Heart murmur    slight  . PONV (postoperative nausea and vomiting)     Past Surgical History:  Procedure Laterality  Date  . LAPAROSCOPY N/A 02/09/2016   Procedure: LAPAROSCOPY DIAGNOSTIC;  Surgeon: Aletha Halim, MD;  Location: [facility];  Service: Gynecology;  Laterality: N/A;    Social History   Tobacco Use  . Smoking status: Never Smoker  . Smokeless tobacco: Never Used  Substance Use Topics  . Alcohol use: Yes    Comment: occ until pregnant    Family History  Problem Relation Age of Onset  . Cancer Father   . Throat cancer Father   . Diabetes Mother   . Hypertension Mother   . Heart disease Mother   . Hyperlipidemia Maternal Grandmother     ROS Per hpi  OBJECTIVE:  Today's Vitals   04/15/19 0811  BP: (!) 145/91  Pulse: (!) 120  Temp: 98.4 F (36.9 C)  SpO2: 96%  Weight: 217 lb (98.4 kg)  Height: 5' 4.25" (1.632 m)   Body mass index is 36.96 kg/m.   Physical Exam Vitals signs and nursing note reviewed.  Constitutional:      Appearance: She is well-developed.  HENT:     Head: Normocephalic and atraumatic.     Mouth/Throat:     Pharynx: No oropharyngeal exudate.  Eyes:     General: No  scleral icterus.    Conjunctiva/sclera: Conjunctivae normal.     Pupils: Pupils are equal, round, and reactive to light.  Neck:     Musculoskeletal: Neck supple.  Cardiovascular:     Rate and Rhythm: Normal rate and regular rhythm.     Heart sounds: Normal heart sounds. No murmur. No friction rub. No gallop.   Pulmonary:     Effort: Pulmonary effort is normal.     Breath sounds: Normal breath sounds. No wheezing or rales.  Skin:    General: Skin is warm and dry.  Neurological:     Mental Status: She is alert and oriented to person, place, and time.     No results found for this or any previous visit (from the past 24 hour(s)).  No results found.   ASSESSMENT and PLAN  1. Elevated BP without diagnosis of hypertension Asymptomatic. Recent elevation of BP in setting of SAB. Labs to r/o secondary causes. Discussed LFM (low salt/dash diet, at least 150 minutes of exercise a  week) and continued daily BP monitoring. Reviewed RTC precautions.  - CBC - Comprehensive metabolic panel - TSH  Return in about 4 weeks (around 05/13/2019).    Rutherford Guys, MD [facility] at [address], [place] [address] Ph.  [phone] Fax [phone]

## 2019-04-15 NOTE — Patient Instructions (Addendum)
Please bring blood pressure log to next office visit.    If you have lab work done today you will be contacted with your lab results within the next 2 weeks.  If you have not heard from [place] then please contact us. The fastest way to get your results is to register for My Chart.   IF you received an x-ray today, you will receive an invoice from [facility]. Please contact [facility] at [phone] with questions or concerns regarding your invoice.   IF you received labwork today, you will receive an invoice from [place]. Please contact [facility] at [phone] with questions or concerns regarding your invoice.   Our billing staff will not be able to assist you with questions regarding bills from these companies.  You will be contacted with the lab results as soon as they are available. The fastest way to get your results is to activate your My Chart account. Instructions are located on the last page of this paperwork. If you have not heard from [place] regarding the results in 2 weeks, please contact this office.     DASH Eating Plan DASH stands for "Dietary Approaches to Stop Hypertension." The DASH eating plan is a healthy eating plan that has been shown to reduce high blood pressure (hypertension). It may also reduce your risk for type 2 diabetes, heart disease, and stroke. The DASH eating plan may also help with weight loss. What are tips for following this plan?  General guidelines  Avoid eating more than 2,300 mg (milligrams) of salt (sodium) a day. If you have hypertension, you may need to reduce your sodium intake to 1,500 mg a day.  Limit alcohol intake to no more than 1 drink a day for nonpregnant women and 2 drinks a day for men. One drink equals 12 oz of beer, 5 oz of wine, or 1 oz of hard liquor.  Work with your health care provider to maintain a healthy body weight or to lose weight. Ask what an ideal weight is for you.  Get at least 30 minutes of exercise  that causes your heart to beat faster (aerobic exercise) most days of the week. Activities may include walking, swimming, or biking.  Work with your health care provider or diet and nutrition specialist (dietitian) to adjust your eating plan to your individual calorie needs. Reading food labels   Check food labels for the amount of sodium per serving. Choose foods with less than 5 percent of the Daily Value of sodium. Generally, foods with less than 300 mg of sodium per serving fit into this eating plan.  To find whole grains, look for the word "whole" as the first word in the ingredient list. Shopping  Buy products labeled as "low-sodium" or "no salt added."  Buy fresh foods. Avoid canned foods and premade or frozen meals. Cooking  Avoid adding salt when cooking. Use salt-free seasonings or herbs instead of table salt or sea salt. Check with your health care provider or pharmacist before using salt substitutes.  Do not fry foods. Cook foods using healthy methods such as baking, boiling, grilling, and broiling instead.  Cook with heart-healthy oils, such as olive, canola, soybean, or sunflower oil. Meal planning  Eat a balanced diet that includes: ? 5 or more servings of fruits and vegetables each day. At each meal, try to fill half of your plate with fruits and vegetables. ? Up to 6-8 servings of whole grains each day. ? Less than 6 oz of lean  meat, poultry, or fish each day. A 3-oz serving of meat is about the same size as a deck of cards. One egg equals 1 oz. ? 2 servings of low-fat dairy each day. ? A serving of nuts, seeds, or beans 5 times each week. ? Heart-healthy fats. Healthy fats called Omega-3 fatty acids are found in foods such as flaxseeds and coldwater fish, like sardines, salmon, and mackerel.  Limit how much you eat of the following: ? Canned or prepackaged foods. ? Food that is high in trans fat, such as fried foods. ? Food that is high in saturated fat, such as  fatty meat. ? Sweets, desserts, sugary drinks, and other foods with added sugar. ? Full-fat dairy products.  Do not salt foods before eating.  Try to eat at least 2 vegetarian meals each week.  Eat more home-cooked food and less restaurant, buffet, and fast food.  When eating at a restaurant, ask that your food be prepared with less salt or no salt, if possible. What foods are recommended? The items listed may not be a complete list. Talk with your dietitian about what dietary choices are best for you. Grains Whole-grain or whole-wheat bread. Whole-grain or whole-wheat pasta. Brown rice. [place]. Bulgur. Whole-grain and low-sodium cereals. Pita bread. Low-fat, low-sodium crackers. Whole-wheat flour tortillas. Vegetables Fresh or frozen vegetables (raw, steamed, roasted, or grilled). Low-sodium or reduced-sodium tomato and vegetable juice. Low-sodium or reduced-sodium tomato sauce and tomato paste. Low-sodium or reduced-sodium canned vegetables. Fruits All fresh, dried, or frozen fruit. Canned fruit in natural juice (without added sugar). Meat and other protein foods Skinless chicken or [place]. Ground chicken or [place]. Pork with fat trimmed off. Fish and seafood. Egg whites. Dried beans, peas, or lentils. Unsalted nuts, nut butters, and seeds. Unsalted canned beans. Lean cuts of beef with fat trimmed off. Low-sodium, lean deli meat. Dairy Low-fat (1%) or fat-free (skim) milk. Fat-free, low-fat, or reduced-fat cheeses. Nonfat, low-sodium ricotta or cottage cheese. Low-fat or nonfat yogurt. Low-fat, low-sodium cheese. Fats and oils Soft margarine without trans fats. Vegetable oil. Low-fat, reduced-fat, or light mayonnaise and salad dressings (reduced-sodium). Canola, safflower, olive, soybean, and sunflower oils. Avocado. Seasoning and other foods Herbs. Spices. Seasoning mixes without salt. Unsalted popcorn and pretzels. Fat-free sweets. What foods are not recommended? The items  listed may not be a complete list. Talk with your dietitian about what dietary choices are best for you. Grains Baked goods made with fat, such as croissants, muffins, or some breads. Dry pasta or rice meal packs. Vegetables Creamed or fried vegetables. Vegetables in a cheese sauce. Regular canned vegetables (not low-sodium or reduced-sodium). Regular canned tomato sauce and paste (not low-sodium or reduced-sodium). Regular tomato and vegetable juice (not low-sodium or reduced-sodium). Angie Fava. Olives. Fruits Canned fruit in a light or heavy syrup. Fried fruit. Fruit in cream or butter sauce. Meat and other protein foods Fatty cuts of meat. Ribs. Fried meat. Berniece Salines. Sausage. Bologna and other processed lunch meats. Salami. Fatback. Hotdogs. Bratwurst. Salted nuts and seeds. Canned beans with added salt. Canned or smoked fish. Whole eggs or egg yolks. Chicken or [place] with skin. Dairy Whole or 2% milk, cream, and half-and-half. Whole or full-fat cream cheese. Whole-fat or sweetened yogurt. Full-fat cheese. Nondairy creamers. Whipped toppings. Processed cheese and cheese spreads. Fats and oils Butter. Stick margarine. Lard. Shortening. Ghee. Bacon fat. Tropical oils, such as coconut, palm kernel, or palm oil. Seasoning and other foods Salted popcorn and pretzels. Onion salt, garlic salt, seasoned salt, table salt,  and sea salt. Worcestershire sauce. Tartar sauce. Barbecue sauce. Teriyaki sauce. Soy sauce, including reduced-sodium. Steak sauce. Canned and packaged gravies. Fish sauce. Oyster sauce. Cocktail sauce. Horseradish that you find on the shelf. Ketchup. Mustard. Meat flavorings and tenderizers. Bouillon cubes. Hot sauce and Tabasco sauce. Premade or packaged marinades. Premade or packaged taco seasonings. Relishes. Regular salad dressings. Where to find more information:  National Heart, Lung, and [place]: https://wilson-eaton.com/  American Heart Association: www.heart.org Summary  The  DASH eating plan is a healthy eating plan that has been shown to reduce high blood pressure (hypertension). It may also reduce your risk for type 2 diabetes, heart disease, and stroke.  With the DASH eating plan, you should limit salt (sodium) intake to 2,300 mg a day. If you have hypertension, you may need to reduce your sodium intake to 1,500 mg a day.  When on the DASH eating plan, aim to eat more fresh fruits and vegetables, whole grains, lean proteins, low-fat dairy, and heart-healthy fats.  Work with your health care provider or diet and nutrition specialist (dietitian) to adjust your eating plan to your individual calorie needs. This information is not intended to replace advice given to you by your health care provider. Make sure you discuss any questions you have with your health care provider. Document Released: 06/16/2011 Document Revised: 06/09/2017 Document Reviewed: 06/20/2016 Elsevier Patient Education  2020 [employer].

## 2019-04-16 ENCOUNTER — Other Ambulatory Visit: Payer: BC Managed Care – PPO

## 2019-04-16 LAB — COMPREHENSIVE METABOLIC PANEL
ALT: 14 IU/L (ref 0–32)
AST: 15 IU/L (ref 0–40)
Albumin/Globulin Ratio: 1.4 (ref 1.2–2.2)
Albumin: 4.2 g/dL (ref 3.8–4.8)
Alkaline Phosphatase: 70 IU/L (ref 39–117)
BUN/Creatinine Ratio: 15 (ref 9–23)
BUN: 11 mg/dL (ref 6–20)
Bilirubin Total: 0.2 mg/dL (ref 0.0–1.2)
CO2: 21 mmol/L (ref 20–29)
Calcium: 9.2 mg/dL (ref 8.7–10.2)
Chloride: 107 mmol/L — ABNORMAL HIGH (ref 96–106)
Creatinine, Ser: 0.73 mg/dL (ref 0.57–1.00)
GFR calc Af Amer: 125 mL/min/{1.73_m2} (ref >59)
GFR calc non Af Amer: 109 mL/min/{1.73_m2} (ref >59)
Globulin, Total: 2.9 g/dL (ref 1.5–4.5)
Glucose: 91 mg/dL (ref 65–99)
Potassium: 4.5 mmol/L (ref 3.5–5.2)
Sodium: 140 mmol/L (ref 134–144)
Total Protein: 7.1 g/dL (ref 6.0–8.5)

## 2019-04-16 LAB — CBC
Hematocrit: 40.2 % (ref 34.0–46.6)
Hemoglobin: 13.8 g/dL (ref 11.1–15.9)
MCH: 30.6 pg (ref 26.6–33.0)
MCHC: 34.3 g/dL (ref 31.5–35.7)
MCV: 89 fL (ref 79–97)
Platelets: 249 10*3/uL (ref 150–450)
RBC: 4.51 x10E6/uL (ref 3.77–5.28)
RDW: 12.7 % (ref 11.7–15.4)
WBC: 6.5 10*3/uL (ref 3.4–10.8)

## 2019-04-16 LAB — TSH: TSH: 0.92 u[IU]/mL (ref 0.450–4.500)

## 2019-04-23 ENCOUNTER — Other Ambulatory Visit: Payer: Self-pay

## 2019-04-23 ENCOUNTER — Other Ambulatory Visit: Payer: BC Managed Care – PPO

## 2019-04-23 DIAGNOSIS — O039 Complete or unspecified spontaneous abortion without complication: Secondary | ICD-10-CM

## 2019-04-23 DIAGNOSIS — Z5189 Encounter for other specified aftercare: Secondary | ICD-10-CM | POA: Diagnosis not present

## 2019-04-24 LAB — BETA HCG QUANT (REF LAB): hCG Quant: 2 m[IU]/mL

## 2019-05-08 ENCOUNTER — Ambulatory Visit (HOSPITAL_COMMUNITY): Payer: BC Managed Care – PPO

## 2019-05-16 ENCOUNTER — Other Ambulatory Visit: Payer: Self-pay

## 2019-05-16 ENCOUNTER — Ambulatory Visit: Payer: BC Managed Care – PPO | Admitting: Family Medicine

## 2019-05-16 ENCOUNTER — Encounter: Payer: Self-pay | Admitting: Family Medicine

## 2019-05-16 VITALS — BP 148/78 | HR 114 | Temp 98.6°F | Ht 64.5 in | Wt 218.0 lb

## 2019-05-16 DIAGNOSIS — R030 Elevated blood-pressure reading, without diagnosis of hypertension: Secondary | ICD-10-CM

## 2019-05-16 NOTE — Progress Notes (Signed)
11/5/202011:14 AM  Katie Ward June 17, 1986, 33 y.o., female [id number]  Chief Complaint  Patient presents with  . Hypertension    HPI:   Patient is a 33 y.o. female who presents today for elevated BP  Has been checking daily for past month She had been doing well with exercise and weight loss until her bday Brings in bp log for review Range of last 7 days: 112-133/73-87 Reports component of white coat syndrome She plans on trying to conceive again next year  BP Readings from Last 3 Encounters:  05/16/19 (!) 148/78  04/15/19 (!) 145/91  04/02/19 (!) 161/91   Wt Readings from Last 3 Encounters:  05/16/19 218 lb (98.9 kg)  04/15/19 217 lb (98.4 kg)  04/02/19 216 lb 14.4 oz (98.4 kg)    Depression screen [facility] 2/9 05/16/2019 04/15/2019 04/02/2019  Decreased Interest 0 0 0  Down, Depressed, Hopeless 0 0 0  PHQ - 2 Score 0 0 0  Altered sleeping - - 0  Tired, decreased energy - - 0  Change in appetite - - 0  Feeling bad or failure about yourself  - - 0  Trouble concentrating - - 0  Moving slowly or fidgety/restless - - 0  Suicidal thoughts - - 0  PHQ-9 Score - - 0    Fall Risk  05/16/2019 04/15/2019 06/24/2017 06/13/2017  Falls in the past year? 0 0 No No  Number falls in past yr: 0 0 - -  Injury with Fall? 0 - - -     Allergies  Allergen Reactions  . Other Hives and Other (See Comments)    mango    Prior to Admission medications   Medication Sig Start Date End Date Taking? Authorizing Provider  Multiple Vitamins-Minerals (MULTI ADULT GUMMIES PO) Take by mouth.   Yes [provider]    Past Medical History:  Diagnosis Date  . Anemia   . 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 Fibroid   . Headache   . Heart murmur    slight  . PONV (postoperative nausea and vomiting)     Past Surgical History:  Procedure Laterality Date  . LAPAROSCOPY N/A 02/09/2016   Procedure: LAPAROSCOPY DIAGNOSTIC;  Surgeon: Aletha Halim, MD;  Location: [place]  ORS;  Service: Gynecology;  Laterality: N/A;    Social History   Tobacco Use  . Smoking status: Never Smoker  . Smokeless tobacco: Never Used  Substance Use Topics  . Alcohol use: Yes    Comment: occ until pregnant    Family History  Problem Relation Age of Onset  . Cancer Father   . Throat cancer Father   . Diabetes Mother   . Hypertension Mother   . Heart disease Mother   . Hyperlipidemia Maternal Grandmother     ROS Per hpi  OBJECTIVE:  Today's Vitals   05/16/19 1107 05/16/19 1118  BP: (!) 168/94 (!) 148/78  Pulse: (!) 114   Temp: 98.6 F (37 C)   SpO2: 98%   Weight: 218 lb (98.9 kg)   Height: 5' 4.5" (1.638 m)    Body mass index is 36.84 kg/m.   Physical Exam Vitals signs and nursing note reviewed.  Constitutional:      Appearance: She is well-developed.  HENT:     Head: Normocephalic and atraumatic.  Eyes:     General: No scleral icterus.    Conjunctiva/sclera: Conjunctivae normal.     Pupils: Pupils are equal, round, and reactive to light.  Neck:     Musculoskeletal: Neck supple.  Pulmonary:     Effort: Pulmonary effort is normal.  Skin:    General: Skin is warm and dry.  Neurological:     Mental Status: She is alert and oriented to person, place, and time.     No results found for this or any previous visit (from the past 24 hour(s)).  No results found.   ASSESSMENT and PLAN  1. Elevated BP without diagnosis of hypertension Home readings < 140/90. Continue with LFM and home BP monitoring. RTC precautions given.  Return in about 3 months (around 08/16/2019).    Rutherford Guys, MD Primary Care at [place], [place] [address] Ph.  [phone] Fax [phone]

## 2019-06-13 DIAGNOSIS — H10413 Chronic giant papillary conjunctivitis, bilateral: Secondary | ICD-10-CM | POA: Diagnosis not present

## 2019-08-01 DIAGNOSIS — Z3201 Encounter for pregnancy test, result positive: Secondary | ICD-10-CM | POA: Diagnosis not present

## 2019-08-01 DIAGNOSIS — Z3491 Encounter for supervision of normal pregnancy, unspecified, first trimester: Secondary | ICD-10-CM | POA: Diagnosis not present

## 2019-08-15 DIAGNOSIS — O021 Missed abortion: Secondary | ICD-10-CM | POA: Diagnosis not present

## 2019-08-15 DIAGNOSIS — O26841 Uterine size-date discrepancy, first trimester: Secondary | ICD-10-CM | POA: Diagnosis not present

## 2019-08-22 ENCOUNTER — Ambulatory Visit: Payer: BC Managed Care – PPO | Admitting: Family Medicine

## 2019-08-26 DIAGNOSIS — D259 Leiomyoma of uterus, unspecified: Secondary | ICD-10-CM | POA: Diagnosis not present

## 2019-08-26 DIAGNOSIS — O021 Missed abortion: Secondary | ICD-10-CM | POA: Diagnosis not present

## 2019-08-26 DIAGNOSIS — Z09 Encounter for follow-up examination after completed treatment for conditions other than malignant neoplasm: Secondary | ICD-10-CM | POA: Diagnosis not present

## 2019-08-26 DIAGNOSIS — Z309 Encounter for contraceptive management, unspecified: Secondary | ICD-10-CM | POA: Diagnosis not present

## 2019-11-15 DIAGNOSIS — O2621 Pregnancy care for patient with recurrent pregnancy loss, first trimester: Secondary | ICD-10-CM | POA: Diagnosis not present

## 2019-11-15 DIAGNOSIS — D252 Subserosal leiomyoma of uterus: Secondary | ICD-10-CM | POA: Diagnosis not present

## 2019-11-15 DIAGNOSIS — N96 Recurrent pregnancy loss: Secondary | ICD-10-CM | POA: Diagnosis not present

## 2019-11-15 DIAGNOSIS — D251 Intramural leiomyoma of uterus: Secondary | ICD-10-CM | POA: Diagnosis not present

## 2019-11-15 DIAGNOSIS — N801 Endometriosis of ovary: Secondary | ICD-10-CM | POA: Diagnosis not present

## 2019-11-15 DIAGNOSIS — N809 Endometriosis, unspecified: Secondary | ICD-10-CM | POA: Diagnosis not present

## 2019-11-17 DIAGNOSIS — L259 Unspecified contact dermatitis, unspecified cause: Secondary | ICD-10-CM | POA: Diagnosis not present

## 2019-11-26 DIAGNOSIS — Z3202 Encounter for pregnancy test, result negative: Secondary | ICD-10-CM | POA: Diagnosis not present

## 2019-11-26 DIAGNOSIS — Z3141 Encounter for fertility testing: Secondary | ICD-10-CM | POA: Diagnosis not present

## 2019-11-26 DIAGNOSIS — N96 Recurrent pregnancy loss: Secondary | ICD-10-CM | POA: Diagnosis not present

## 2019-12-06 ENCOUNTER — Ambulatory Visit: Payer: BC Managed Care – PPO | Admitting: Family Medicine

## 2019-12-06 ENCOUNTER — Encounter: Payer: Self-pay | Admitting: Family Medicine

## 2019-12-06 ENCOUNTER — Other Ambulatory Visit: Payer: Self-pay

## 2019-12-06 VITALS — BP 126/87 | HR 117 | Temp 98.4°F | Ht 64.0 in | Wt 202.6 lb

## 2019-12-06 DIAGNOSIS — Z23 Encounter for immunization: Secondary | ICD-10-CM | POA: Diagnosis not present

## 2019-12-06 DIAGNOSIS — L2389 Allergic contact dermatitis due to other agents: Secondary | ICD-10-CM

## 2019-12-06 MED ORDER — TRIAMCINOLONE ACETONIDE 0.1 % EX CREA: 1.0000 "application " | TOPICAL_CREAM | Freq: Two times a day (BID) | CUTANEOUS | 1 refills | Status: AC

## 2019-12-06 NOTE — Progress Notes (Signed)
Patient ID: Katie Ward, female    DOB: Oct 27, 1985  Age: 34 y.o. MRN: [id number]  Chief Complaint  Patient presents with  . Rash    Pt stated that she has a rash on both of her hands since 11/15/2019 but its more on the Rt hand.    Subjective:  Several weeks ago the patient developed a rash on her hands.  She went to urgent care and received a tapered dose of prednisone.  She was doing okay and it seems to come back.  She washes her dishes using Clorox.  We discussed using something more gentle.  No other known chemical contact.  Sometimes wears gloves.  She works an Psychiatric nurse.  Her husband works outdoors a lot with trees and could be giving her contact dermatitis. Current allergies, medications, problem list, past/family and social histories reviewed.  Objective:  BP 126/87 (BP Location: Right Arm, Patient Position: Sitting, Cuff Size: Normal)   Pulse (!) 117   Temp 98.4 F (36.9 C) (Temporal)   Ht 5\' 4"  (1.626 m)   Wt 202 lb 9.6 oz (91.9 kg)   LMP 11/18/2019   SpO2 98%   BMI 34.78 kg/m   Has a contact appearing dermatitis in an unusual distribution on the back of the proximal phalanx of her digits and the knuckles of her hands.  Does not have any under her wide ring band.  Assessment & Plan:   Assessment: 1. Allergic contact dermatitis due to other agents   2. Need for Tdap vaccination       Plan: See instructions  Orders Placed This Encounter  Procedures  . Tdap vaccine greater than or equal to 7yo IM    Meds ordered this encounter  Medications  . triamcinolone cream (KENALOG) 0.1 %    Sig: Apply 1 application topically 2 (two) times daily.    Dispense:  30 g    Refill:  1         Patient Instructions   Use triamcinolone cream 2-3 times daily on the rash as needed  Return if you are getting worse  Try to avoid excessive chemical contact with your hands     If you have lab work done today you will be contacted with your lab  results within the next 2 weeks.  If you have not heard from [place] then please contact us. The fastest way to get your results is to register for My Chart.   IF you received an x-ray today, you will receive an invoice from [facility]. Please contact [facility] at [phone] with questions or concerns regarding your invoice.   IF you received labwork today, you will receive an invoice from [place]. Please contact [facility] at [phone] with questions or concerns regarding your invoice.   Our billing staff will not be able to assist you with questions regarding bills from these companies.  You will be contacted with the lab results as soon as they are available. The fastest way to get your results is to activate your My Chart account. Instructions are located on the last page of this paperwork. If you have not heard from [place] regarding the results in 2 weeks, please contact this office.        Return if symptoms worsen or fail to improve.   Ruben Reason, MD 12/06/2019

## 2019-12-06 NOTE — Patient Instructions (Addendum)
Use triamcinolone cream 2-3 times daily on the rash as needed  Return if you are getting worse  Try to avoid excessive chemical contact with your hands     If you have lab work done today you will be contacted with your lab results within the next 2 weeks.  If you have not heard from [place] then please contact us. The fastest way to get your results is to register for My Chart.   IF you received an x-ray today, you will receive an invoice from [facility]. Please contact [facility] at [phone] with questions or concerns regarding your invoice.   IF you received labwork today, you will receive an invoice from [place]. Please contact [facility] at [phone] with questions or concerns regarding your invoice.   Our billing staff will not be able to assist you with questions regarding bills from these companies.  You will be contacted with the lab results as soon as they are available. The fastest way to get your results is to activate your My Chart account. Instructions are located on the last page of this paperwork. If you have not heard from [place] regarding the results in 2 weeks, please contact this office.

## 2019-12-25 DIAGNOSIS — N83201 Unspecified ovarian cyst, right side: Secondary | ICD-10-CM | POA: Diagnosis not present

## 2019-12-25 DIAGNOSIS — D259 Leiomyoma of uterus, unspecified: Secondary | ICD-10-CM | POA: Diagnosis not present

## 2019-12-25 DIAGNOSIS — N801 Endometriosis of ovary: Secondary | ICD-10-CM | POA: Diagnosis not present

## 2019-12-25 DIAGNOSIS — Z01419 Encounter for gynecological examination (general) (routine) without abnormal findings: Secondary | ICD-10-CM | POA: Diagnosis not present

## 2019-12-25 DIAGNOSIS — N83202 Unspecified ovarian cyst, left side: Secondary | ICD-10-CM | POA: Diagnosis not present

## 2020-01-01 ENCOUNTER — Telehealth: Payer: BC Managed Care – PPO | Admitting: Family

## 2020-01-01 DIAGNOSIS — M545 Low back pain, unspecified: Secondary | ICD-10-CM

## 2020-01-01 MED ORDER — BACLOFEN 10 MG PO TABS: 10.0000 mg | ORAL_TABLET | Freq: Three times a day (TID) | ORAL | 0 refills | Status: AC

## 2020-01-01 MED ORDER — NAPROXEN 500 MG PO TABS: 500.0000 mg | ORAL_TABLET | Freq: Two times a day (BID) | ORAL | 0 refills | Status: AC

## 2020-01-01 NOTE — Progress Notes (Signed)

## 2020-01-23 DIAGNOSIS — R1900 Intra-abdominal and pelvic swelling, mass and lump, unspecified site: Secondary | ICD-10-CM | POA: Diagnosis not present

## 2020-01-23 DIAGNOSIS — R971 Elevated cancer antigen 125 [CA 125]: Secondary | ICD-10-CM | POA: Diagnosis not present

## 2020-01-23 DIAGNOSIS — N809 Endometriosis, unspecified: Secondary | ICD-10-CM | POA: Diagnosis not present

## 2020-02-14 IMAGING — US US OB < 14 WEEKS - US OB TV
1 series · 14 of 28 positions shown · non-contrast
Comparison: None for this gestation

CLINICAL DATA: Pregnancy of uncertain fetal viability, history of
fibroids, surgery for endometriosis

EXAM:
OBSTETRIC <14 WK US AND TRANSVAGINAL OB US
TECHNIQUE: Both transabdominal and transvaginal ultrasound examinations were
performed for complete evaluation of the gestation as well as the
maternal uterus, adnexal regions, and pelvic cul-de-sac.
Transvaginal technique was performed to assess early pregnancy.

[Series 2: us ob < 14 weeks - us ob tv · 99 acquisitions, 14 frames shown]
[im 4/99]
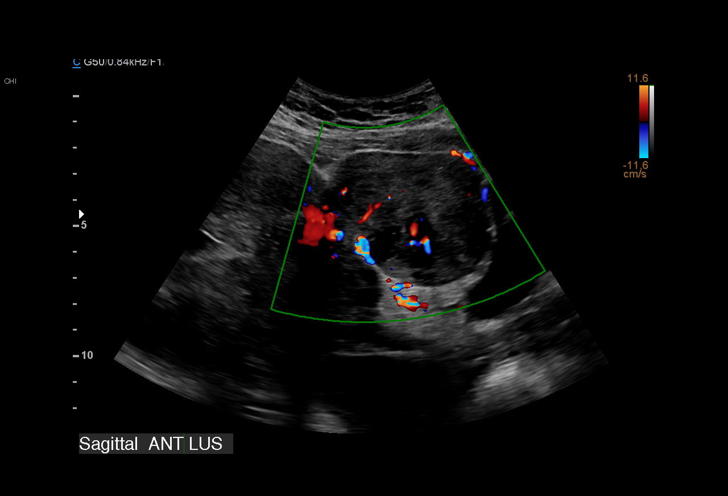
[im 11/99]
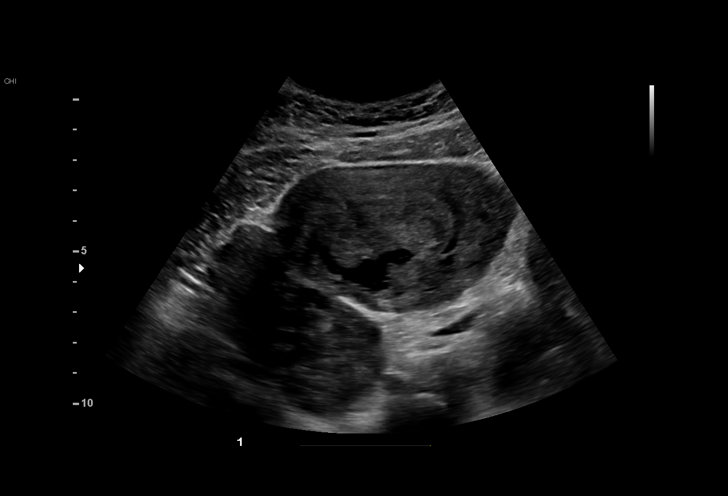
[im 19/99]
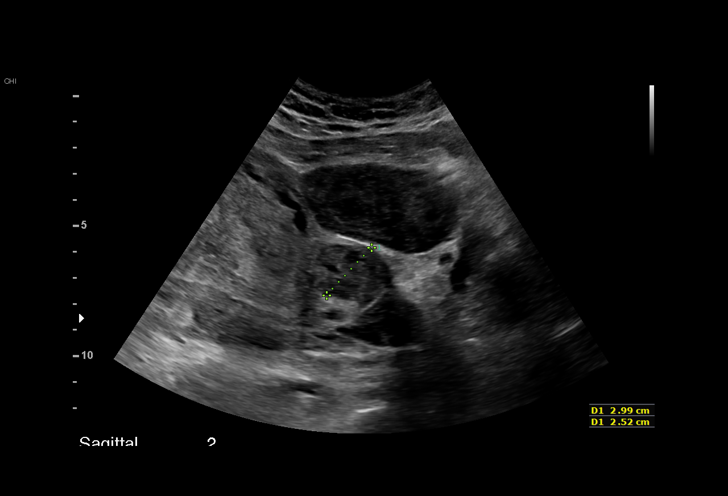
[im 26/99]
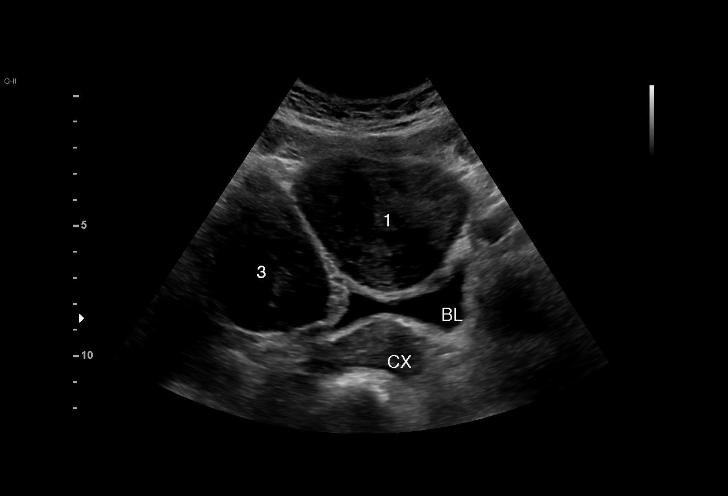
[im 33/99]
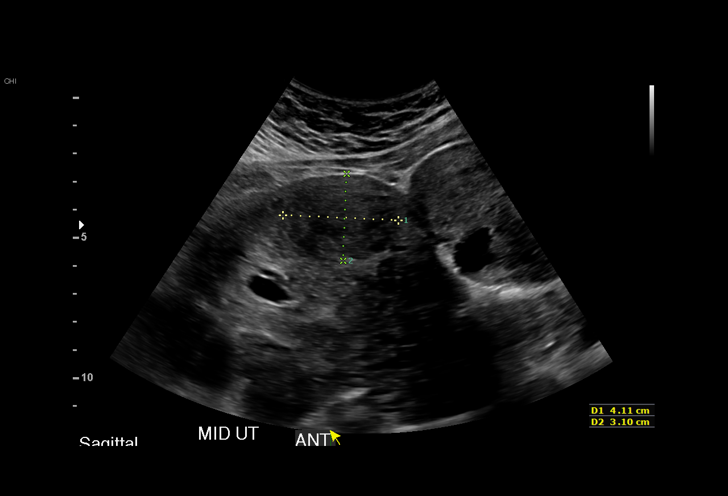
[im 40/99]
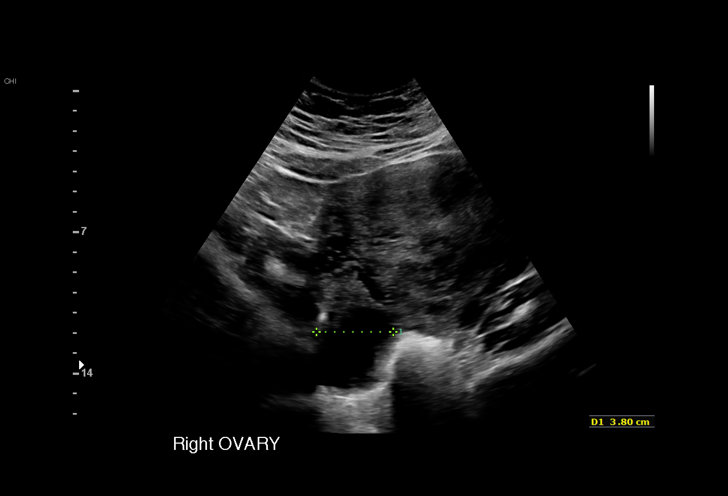
[im 48/99]
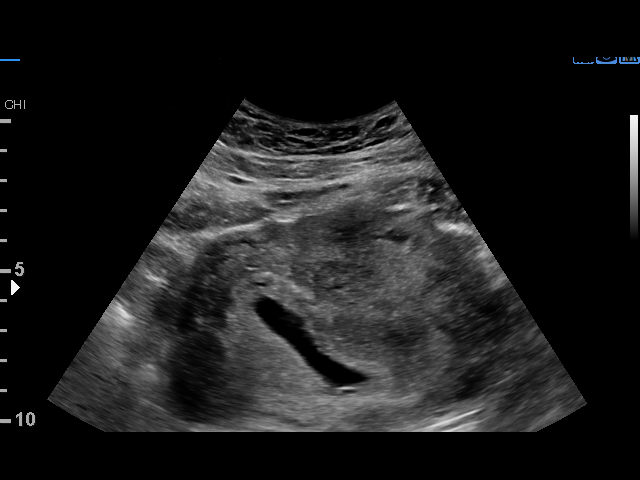
[im 55/99]
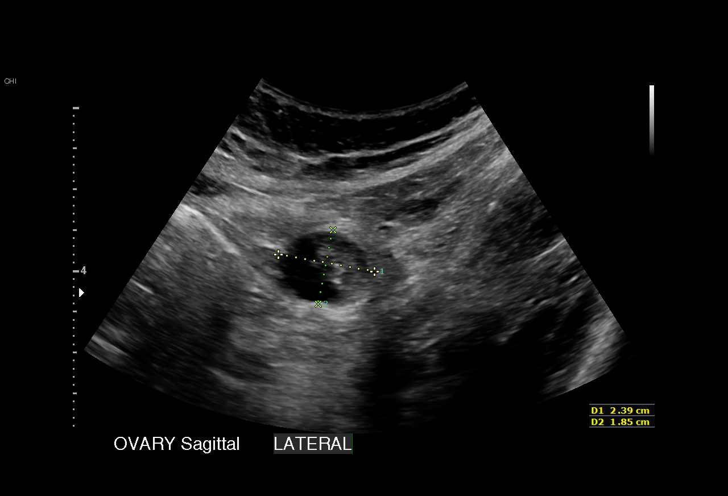
[im 62/99]
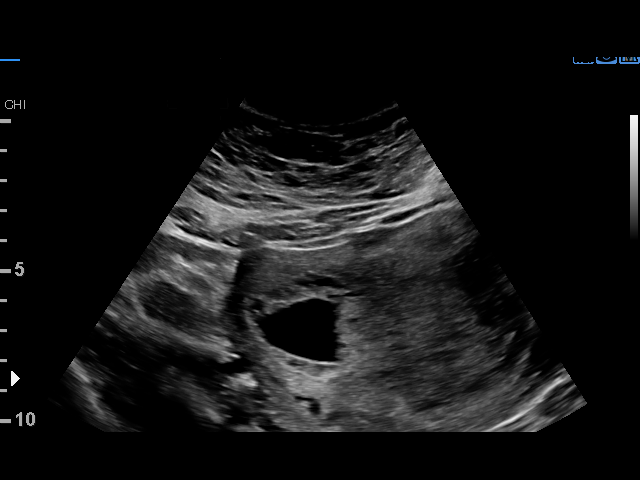
[im 69/99]
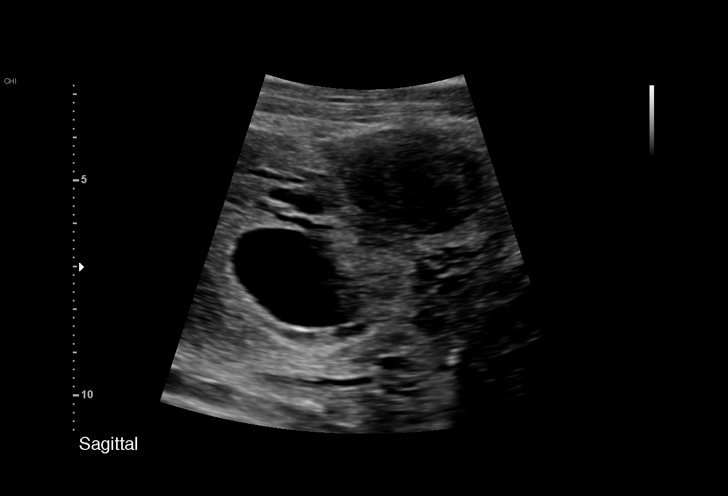
[im 77/99]
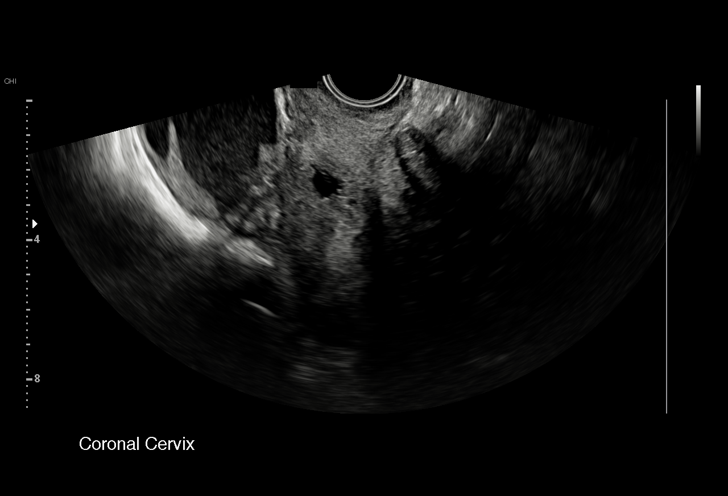
[im 84/99]
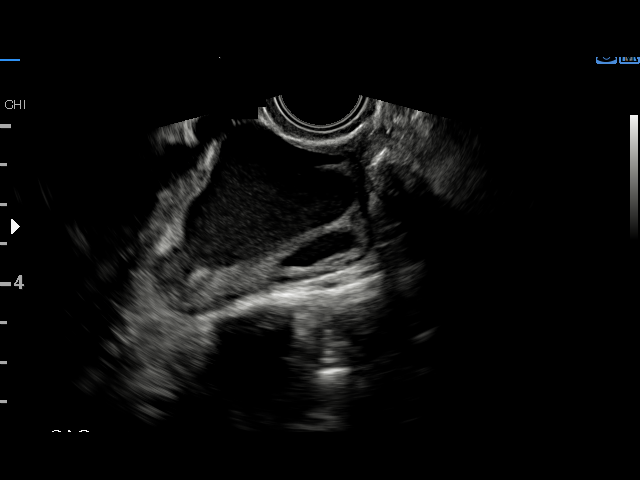
[im 91/99]
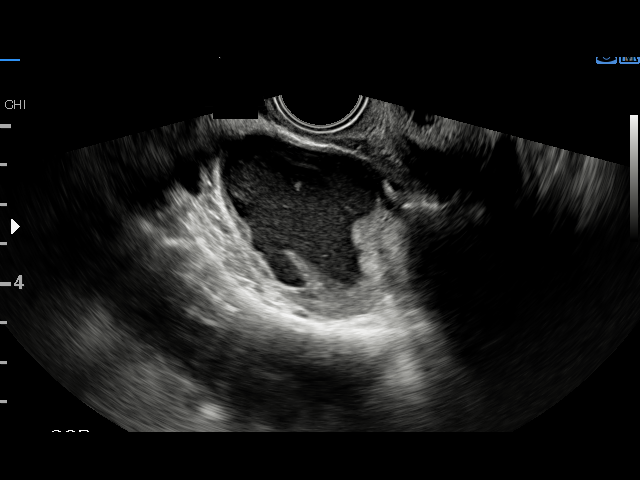
[im 99/99]
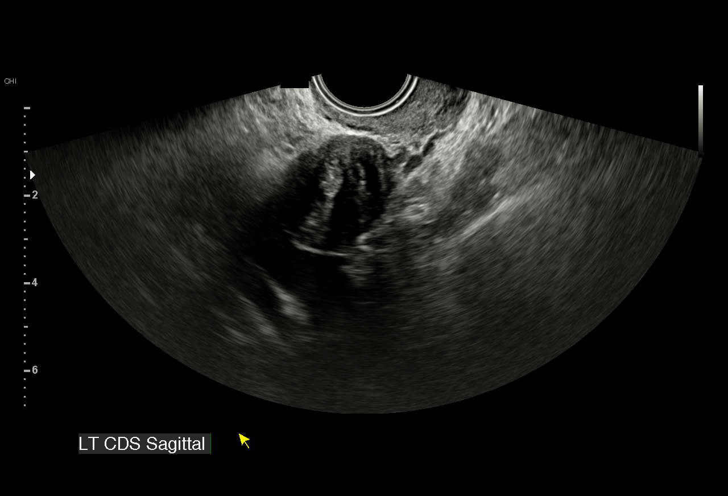

[14 of 28 positions shown; findings below may reference images not displayed]

FINDINGS: Intrauterine gestational sac: Present, single, abnormal deformed and
elongated

Yolk sac:  Not identified

Embryo:  Not identified

Cardiac Activity: N/A

Heart Rate: N/A  bpm

MSD: 45.3 mm   10 w   0 d

Subchorionic hemorrhage:  None visualized.

Maternal uterus/adnexae:

Enlarged uterus containing multiple masses consistent with uterine
leiomyomata.

Largest lesions are:

Exophytic from anterior wall of mid uterus 6.5 x 5.3 x 8.0 cm

Exophytic from RIGHT lateral lower uterus 6.8 x 5.3 x 4.6 cm

Subserosal posterior mid uterus 3.7 x 3.0 x 4.0 cm

Subserosal anterior wall mid upper uterus 4.1 x 3.1 x 3.8 cm.

RIGHT ovary contains a complex slightly irregular cyst 4.6 x 3.4 x
4.0 cm with homogeneous low level internal echogenicity throughout
the lesion most consistent with an endometrioma.

LEFT ovary contains a complex hypoechoic nodule 2.5 x 5.6 x 3.9 cm
with a thick rim, several thick irregular septations and scattered
internal hyperechogenicity, of uncertain etiology.

This lesion demonstrates no hypervascularity on color Doppler
imaging.

Small amount of free fluid in pelvis adjacent to LEFT ovary.

No other pelvic masses.
IMPRESSION: Large empty gestational sac within the uterus with mean sac diameter
of 45 mm.

Findings meet definitive criteria for failed pregnancy. This follows
SRU consensus guidelines: Diagnostic Criteria for Nonviable
Pregnancy Early in the First Trimester. N Engl J Med
0286;[DATE].

Endometrioma of the RIGHT ovary/adnexa.

Complex cystic lesion of the LEFT adnexa/ovary measuring 2.5 x 5.6 x
3.9 cm, of uncertain etiology; this could represent a nonspecific
cystic ovarian mass, old hemorrhagic cyst, atypical endometrioma,
coexistent ectopic pregnancy not excluded with this appearance.

Recommend serial quantitative beta HCG and follow-up imaging to
definitively exclude a coexistent heterotopic pregnancy.

These results will be called to the ordering clinician or
representative by the Radiologist Assistant, and communication
documented in the PACS or zVision Dashboard.
